# Patient Record
Sex: Female | Born: 1980
Health system: Southern US, Community
[De-identification: ages and names within clinical notes are randomized; demographics above are authoritative.]

## PROBLEM LIST (undated history)

## (undated) DIAGNOSIS — L0390 Cellulitis, unspecified: Secondary | ICD-10-CM

## (undated) DIAGNOSIS — I10 Essential (primary) hypertension: Secondary | ICD-10-CM

## (undated) HISTORY — PX: NO PAST SURGERIES: SHX2092

## (undated) HISTORY — DX: Essential (primary) hypertension: I10

---

## 2002-02-11 ENCOUNTER — Emergency Department (HOSPITAL_COMMUNITY): Admission: EM | Admit: 2002-02-11 | Discharge: 2002-02-11 | Payer: Self-pay | Admitting: *Deleted

## 2002-02-11 ENCOUNTER — Encounter: Payer: Self-pay | Admitting: *Deleted

## 2002-05-12 ENCOUNTER — Emergency Department (HOSPITAL_COMMUNITY): Admission: EM | Admit: 2002-05-12 | Discharge: 2002-05-12 | Payer: Self-pay | Admitting: *Deleted

## 2003-11-04 ENCOUNTER — Emergency Department (HOSPITAL_COMMUNITY): Admission: EM | Admit: 2003-11-04 | Discharge: 2003-11-04 | Payer: Self-pay | Admitting: Emergency Medicine

## 2008-07-24 ENCOUNTER — Emergency Department (HOSPITAL_COMMUNITY): Admission: EM | Admit: 2008-07-24 | Discharge: 2008-07-24 | Payer: Self-pay | Admitting: *Deleted

## 2008-12-19 ENCOUNTER — Emergency Department (HOSPITAL_COMMUNITY): Admission: EM | Admit: 2008-12-19 | Discharge: 2008-12-19 | Payer: Self-pay | Admitting: Emergency Medicine

## 2010-05-22 ENCOUNTER — Emergency Department (HOSPITAL_COMMUNITY): Admission: EM | Admit: 2010-05-22 | Discharge: 2010-05-22 | Payer: Self-pay | Admitting: Emergency Medicine

## 2011-07-14 ENCOUNTER — Encounter: Payer: Self-pay | Admitting: *Deleted

## 2011-07-14 ENCOUNTER — Emergency Department (HOSPITAL_COMMUNITY)
Admission: EM | Admit: 2011-07-14 | Discharge: 2011-07-14 | Disposition: A | Discharge status: Home / Self Care | Payer: Self-pay | Attending: Emergency Medicine | Admitting: Emergency Medicine

## 2011-07-14 DIAGNOSIS — F17200 Nicotine dependence, unspecified, uncomplicated: Secondary | ICD-10-CM | POA: Insufficient documentation

## 2011-07-14 DIAGNOSIS — N949 Unspecified condition associated with female genital organs and menstrual cycle: Secondary | ICD-10-CM | POA: Insufficient documentation

## 2011-07-14 DIAGNOSIS — N938 Other specified abnormal uterine and vaginal bleeding: Secondary | ICD-10-CM | POA: Insufficient documentation

## 2011-07-14 LAB — URINALYSIS, ROUTINE W REFLEX MICROSCOPIC
Bilirubin Urine: NEGATIVE
Glucose, UA: NEGATIVE mg/dL
Hgb urine dipstick: NEGATIVE
Ketones, ur: NEGATIVE mg/dL
Leukocytes, UA: NEGATIVE
Nitrite: NEGATIVE
Protein, ur: NEGATIVE mg/dL
Specific Gravity, Urine: >1.03 — ABNORMAL HIGH (ref 1.005–1.030)
Urobilinogen, UA: 0.2 mg/dL (ref 0.0–1.0)
pH: 5.5 (ref 5.0–8.0)

## 2011-07-14 LAB — WET PREP, GENITAL
Clue Cells Wet Prep HPF POC: NONE SEEN
Trich, Wet Prep: NONE SEEN
Yeast Wet Prep HPF POC: NONE SEEN

## 2011-07-14 LAB — POCT PREGNANCY, URINE: Preg Test, Ur: NEGATIVE

## 2011-07-14 MED ORDER — MEDROXYPROGESTERONE ACETATE 5 MG PO TABS: 10.0000 mg | ORAL_TABLET | Freq: Every day | ORAL | Status: DC

## 2011-07-14 NOTE — ED Notes (Signed)
Patient does not need anything at this time. Patient was informed she could go ahead and get dressed while waiting on UA & pap results

## 2011-07-14 NOTE — ED Notes (Signed)
Vaginal bleeding for 20 days

## 2011-07-23 NOTE — ED Provider Notes (Signed)
History     CSN: [id number] Arrival date & time: 07/14/2011  9:17 PM   First MD Initiated Contact with Patient 07/14/11 2201      Chief Complaint  Patient presents with  . Vaginal Bleeding    (Consider location/radiation/quality/duration/timing/severity/associated sxs/prior treatment) HPI.... vaginal bleeding for 20 days. Several pad changes a day.  No lightheadedness or dizziness.  Is sexually active in a lesbian relationship. Patient states no female sex partners. No vaginal discharge, fever, dysuria, abdominal pain, flank pain.  History reviewed. No pertinent past medical history.  History reviewed. No pertinent past surgical history.  No family history on file.  History  Substance Use Topics  . Smoking status: Current Everyday Smoker -- 0.5 packs/day  . Smokeless tobacco: Not on file  . Alcohol Use: Yes     beer per day    OB History    Grav Para Term Preterm Abortions TAB SAB Ect Mult Living                  Review of Systems  Unable to perform ROS   Allergies  Review of patient's allergies indicates no known allergies.  Home Medications   Current Outpatient Rx  Name Route Sig Dispense Refill  . NAPROXEN SODIUM 220 MG PO TABS Oral Take 440 mg by mouth once as needed. For pain     . MEDROXYPROGESTERONE ACETATE 5 MG PO TABS Oral Take 2 tablets (10 mg total) by mouth daily. 20 tablet 0    BP 124/79  Pulse 86  Temp(Src) 98.7 F (37.1 C) (Oral)  Resp 16  Ht 5\' 6"  (1.676 m)  Wt 171 lb 4.8 oz (77.701 kg)  BMI 27.65 kg/m2  SpO2 99%  LMP 06/24/2011  Physical Exam  Nursing note and vitals reviewed. Constitutional: She is oriented to person, place, and time. She appears well-developed and well-nourished.       Obese  HENT:  Head: Normocephalic and atraumatic.  Eyes: Conjunctivae and EOM are normal. Pupils are equal, round, and reactive to light.  Neck: Normal range of motion. Neck supple.  Cardiovascular: Normal rate and regular rhythm.     Pulmonary/Chest: Effort normal and breath sounds normal.  Abdominal: Soft. Bowel sounds are normal.  Genitourinary:       External genitalia normal.  Slight amount of blood in the posterior vault. No pus or discharge. No adnexal or uterine tenderness  Musculoskeletal: Normal range of motion.  Neurological: She is alert and oriented to person, place, and time.  Skin: Skin is warm and dry.  Psychiatric: She has a normal mood and affect.    ED Course  Procedures (including critical care time)  Labs Reviewed  URINALYSIS, ROUTINE W REFLEX MICROSCOPIC - Abnormal; Notable for the following:    Specific Gravity, Urine >1.030 (*)    All other components within normal limits  WET PREP, GENITAL - Abnormal; Notable for the following:    WBC, Wet Prep HPF POC FEW (*)    All other components within normal limits  GC/CHLAMYDIA PROBE AMP, GENITAL  POCT PREGNANCY, URINE  LAB REPORT - SCANNED   No results found.   1. Dysfunctional uterine bleeding       MDM  Pelvic exam was essentially normal with a small amount of blood. No cervical motion tenderness. Wet prep negative. Will Rx with Megace and get followup with local ob gyn        Donnetta Hutching, MD 07/23/11 [phone]

## 2013-12-21 ENCOUNTER — Emergency Department (HOSPITAL_COMMUNITY)
Admission: EM | Admit: 2013-12-21 | Discharge: 2013-12-21 | Disposition: A | Discharge status: Home / Self Care | Payer: BC Managed Care – PPO | Attending: Emergency Medicine | Admitting: Emergency Medicine

## 2013-12-21 ENCOUNTER — Encounter (HOSPITAL_COMMUNITY): Payer: Self-pay | Admitting: Emergency Medicine

## 2013-12-21 DIAGNOSIS — S51812A Laceration without foreign body of left forearm, initial encounter: Secondary | ICD-10-CM

## 2013-12-21 DIAGNOSIS — W268XXA Contact with other sharp object(s), not elsewhere classified, initial encounter: Secondary | ICD-10-CM | POA: Insufficient documentation

## 2013-12-21 DIAGNOSIS — W01119A Fall on same level from slipping, tripping and stumbling with subsequent striking against unspecified sharp object, initial encounter: Secondary | ICD-10-CM | POA: Insufficient documentation

## 2013-12-21 DIAGNOSIS — Y9389 Activity, other specified: Secondary | ICD-10-CM | POA: Insufficient documentation

## 2013-12-21 DIAGNOSIS — S51809A Unspecified open wound of unspecified forearm, initial encounter: Secondary | ICD-10-CM | POA: Insufficient documentation

## 2013-12-21 DIAGNOSIS — Y92009 Unspecified place in unspecified non-institutional (private) residence as the place of occurrence of the external cause: Secondary | ICD-10-CM | POA: Insufficient documentation

## 2013-12-21 DIAGNOSIS — Z23 Encounter for immunization: Secondary | ICD-10-CM | POA: Insufficient documentation

## 2013-12-21 DIAGNOSIS — Z79899 Other long term (current) drug therapy: Secondary | ICD-10-CM | POA: Insufficient documentation

## 2013-12-21 DIAGNOSIS — F17200 Nicotine dependence, unspecified, uncomplicated: Secondary | ICD-10-CM | POA: Insufficient documentation

## 2013-12-21 MED ORDER — TETANUS-DIPHTH-ACELL PERTUSSIS 5-2.5-18.5 LF-MCG/0.5 IM SUSP
0.5000 mL | Freq: Once | INTRAMUSCULAR | Status: AC
  Administered 2013-12-21: 0.5 mL via INTRAMUSCULAR
  Filled 2013-12-21: qty 0.5

## 2013-12-21 NOTE — ED Provider Notes (Signed)
CSN: [id number]     Arrival date & time 12/21/13  1655 History   First MD Initiated Contact with Patient 12/21/13 1729     Chief Complaint  Patient presents with  . Extremity Laceration     (Consider location/radiation/quality/duration/timing/severity/associated sxs/prior Treatment) Patient is a 33 y.o. female presenting with skin laceration. The history is provided by the patient.  Laceration Location:  Shoulder/arm Shoulder/arm laceration location:  L forearm Length (cm):  1 Depth:  Cutaneous Quality: straight   Bleeding: controlled   Time since incident:  1 hour Laceration mechanism:  Broken glass Foreign body present:  No foreign bodies Ineffective treatments:  None tried Tetanus status:  Out of date  Caroline Barnes is a 33 y.o. female who presents to the ED with a laceration to the left forearm. She states that she tripped and cut her arm on a broken mirror. She denies any other injuries.   History reviewed. No pertinent past medical history. History reviewed. No pertinent past surgical history. No family history on file. History  Substance Use Topics  . Smoking status: Current Every Day Smoker -- 0.50 packs/day    Types: Cigarettes  . Smokeless tobacco: Not on file  . Alcohol Use: Yes     Comment: beer per day   OB History   Grav Para Term Preterm Abortions TAB SAB Ect Mult Living                 Review of Systems Negative except as stated in HPI   Allergies  Review of patient's allergies indicates no known allergies.  Home Medications   Current Outpatient Rx  Name  Route  Sig  Dispense  Refill  . EXPIRED: medroxyPROGESTERone (PROVERA) 5 MG tablet   Oral   Take 2 tablets (10 mg total) by mouth daily.   20 tablet   0   . naproxen sodium (ANAPROX) 220 MG tablet   Oral   Take 440 mg by mouth once as needed. For pain           BP 133/76  Pulse 81  Temp(Src) 98.4 F (36.9 C) (Oral)  Resp 18  Ht 5\' 6"  (1.676 m)  Wt 150 lb (68.04 kg)  BMI  24.22 kg/m2  SpO2 99%  LMP 12/14/2013 Physical Exam  Nursing note and vitals reviewed. Constitutional: She is oriented to person, place, and time. She appears well-developed and well-nourished. No distress.  HENT:  Head: Normocephalic.  Eyes: EOM are normal.  Neck: Neck supple.  Cardiovascular: Normal rate.   Pulmonary/Chest: Effort normal.  Abdominal: Soft.  Musculoskeletal: Normal range of motion.       Left forearm: She exhibits no tenderness, no swelling and no deformity.       Arms: 1 cm superficial laceration. Good strength, adequate circulation, good touch sensation.   Neurological: She is alert and oriented to person, place, and time. No cranial nerve deficit.  Skin: Skin is warm and dry.  Psychiatric: She has a normal mood and affect. Her behavior is normal.    ED Course  Procedures Wound cleaned with betadine and skin cleanser. Closed with Dermabond without difficulty.  Tdap given MDM  33 y.o. female with 1 cm laceration to the left forearm closed with Dermabond. She will return for any problems. Discussed with the patient and all questioned fully answered.    [facility] Orlene OchM Neese, [place]NP 12/21/13 [phone]

## 2013-12-21 NOTE — ED Notes (Signed)
Pt states she fell on a mirror at home 1 hour PTA. Small Lac to left forearm. Bleeding controlled.

## 2013-12-23 NOTE — ED Provider Notes (Signed)
Medical screening examination/treatment/procedure(s) were performed by non-physician practitioner and as supervising physician I was immediately available for consultation/collaboration.   Hurman HornJohn M Kuron Docken, MD 12/23/13 [phone]

## 2015-07-24 ENCOUNTER — Encounter (HOSPITAL_COMMUNITY): Payer: Self-pay | Admitting: Emergency Medicine

## 2015-07-24 ENCOUNTER — Emergency Department (HOSPITAL_COMMUNITY)
Admission: EM | Admit: 2015-07-24 | Discharge: 2015-07-24 | Disposition: A | Discharge status: Home / Self Care | Payer: BLUE CROSS/BLUE SHIELD | Attending: Emergency Medicine | Admitting: Emergency Medicine

## 2015-07-24 DIAGNOSIS — Z79899 Other long term (current) drug therapy: Secondary | ICD-10-CM | POA: Diagnosis not present

## 2015-07-24 DIAGNOSIS — R21 Rash and other nonspecific skin eruption: Secondary | ICD-10-CM | POA: Diagnosis present

## 2015-07-24 DIAGNOSIS — L0100 Impetigo, unspecified: Secondary | ICD-10-CM

## 2015-07-24 DIAGNOSIS — Z720 Tobacco use: Secondary | ICD-10-CM | POA: Insufficient documentation

## 2015-07-24 MED ORDER — CLINDAMYCIN HCL 300 MG PO CAPS: 300.0000 mg | ORAL_CAPSULE | Freq: Four times a day (QID) | ORAL | Status: DC

## 2015-07-24 MED ORDER — TRIAMCINOLONE ACETONIDE 0.1 % EX CREA: 1.0000 "application " | TOPICAL_CREAM | Freq: Three times a day (TID) | CUTANEOUS | Status: DC

## 2015-07-24 MED ORDER — DIPHENHYDRAMINE HCL 25 MG PO CAPS
25.0000 mg | ORAL_CAPSULE | Freq: Once | ORAL | Status: AC
  Administered 2015-07-24: 25 mg via ORAL
  Filled 2015-07-24: qty 1

## 2015-07-24 MED ORDER — CLINDAMYCIN HCL 150 MG PO CAPS
300.0000 mg | ORAL_CAPSULE | Freq: Once | ORAL | Status: AC
  Administered 2015-07-24: 300 mg via ORAL
  Filled 2015-07-24: qty 2

## 2015-07-24 NOTE — Discharge Instructions (Signed)
Impetigo, Adult  Impetigo is an infection of the skin. It commonly occurs in young children, but it can also occur in adults. The infection causes itchy blisters and sores that produce brownish-yellow fluid. As the fluid dries, it forms a thick, honey-colored crust. These skin changes usually occur on the face but can also affect other areas of the body. Impetigo usually goes away in 7-10 days with treatment.  CAUSES  Impetigo is caused by two types of bacteria. It may be caused by staphylococci or streptococci bacteria. These bacteria cause impetigo when they get under the surface of the skin. This often happens after some damage to the skin, such as damage from:  · Cuts, scrapes, or scratches.  · Insect bites, especially when you scratch the area of a bite.  · Chickenpox or other illnesses that cause open skin sores.  · Nail biting or chewing.  Impetigo is contagious and can spread easily from one person to another. This may occur through close skin contact or by sharing towels, clothing, or other items with a person who has the infection.  RISK FACTORS  Some things that can increase the risk of getting this infection include:  · Playing sports that include skin-to-skin contact with others.  · Having a skin condition with open sores.  · Having many skin cuts or scrapes.  · Living in an area that has high humidity levels.  · Having poor hygiene.  · Having high levels of staphylococci in your nose.  SIGNS AND SYMPTOMS  Impetigo usually starts out as small blisters, often on the face. The blisters then break open and turn into tiny sores (lesions) with a yellow crust. In some cases, the blisters cause itching or burning. With scratching, irritation, or lack of treatment, these small lesions may get larger. Scratching can also cause impetigo to spread to other parts of the body. The bacteria can get under the fingernails and spread when you touch another area of your skin.  Other possible symptoms include:  · Larger  blisters.  · Pus.  · Swollen lymph glands.  DIAGNOSIS  This condition is usually diagnosed during a physical exam. A skin sample or sample of fluid from a blister may be taken for lab tests that involve growing bacteria (culture test). This can help confirm the diagnosis or help determine the best treatment.  TREATMENT  Mild impetigo can be treated with prescription antibiotic cream. Oral antibiotic medicine may be used in more severe cases. Medicines for itching may also be used.  HOME CARE INSTRUCTIONS  · Take medicines only as directed by your health care provider.  · To help prevent impetigo from spreading to other body areas:    Keep your fingernails short and clean.    Do not scratch the blisters or sores.    Cover infected areas, if necessary, to keep from scratching.  · Gently wash the infected areas with antibiotic soap and water.  · Soak crusted areas in warm, soapy water using antibiotic soap.    Gently rub the areas to remove crusts. Do not scrub.  · Wash your hands often to avoid spreading this infection.  · Stay home until you have used an antibiotic cream for 48 hours (2 days) or an oral antibiotic medicine for 24 hours (1 day). You should only return to work and activities with other people if your skin shows significant improvement.  PREVENTION   To keep the infection from spreading:  · Stay home until you have used   an antibiotic cream for 48 hours or an oral antibiotic for 24 hours.  · Wash your hands often.  · Do not engage in skin-to-skin contact with other people while you have still have blisters.  · Do not share towels, washcloths, or bedding with others while you have the infection.  SEEK MEDICAL CARE IF:  · You develop more blisters or sores despite treatment.  · Other family members get sores.  · Your skin sores are not improving after 48 hours of treatment.  · You have a fever.  SEEK IMMEDIATE MEDICAL CARE IF:  · You see spreading redness or swelling of the skin around your sores.  · You  see red streaks coming from your sores.  · You develop a sore throat.     This information is not intended to replace advice given to you by your health care provider. Make sure you discuss any questions you have with your health care provider.     Document Released: 09/29/2014 Document Reviewed: 09/29/2014  Elsevier Interactive Patient Education ©2016 [employer]

## 2015-07-24 NOTE — ED Notes (Signed)
Pt reports recurrent "boils" . Pt reports sites "pop" and reprots began to itch. Pt reports scratching and intense itching persists. Pt denies any fever,n/v.

## 2015-07-24 NOTE — ED Notes (Signed)
Patient given discharge instruction, verbalized understand. Patient ambulatory out of the department.  

## 2015-07-24 NOTE — ED Notes (Signed)
Multiple abrasion to lower left leg

## 2015-07-25 NOTE — ED Provider Notes (Signed)
CSN: [id number]     Arrival date & time 07/24/15  1708 History   First MD Initiated Contact with Patient 07/24/15 1734     Chief Complaint  Patient presents with  . Recurrent Skin Infections     (Consider location/radiation/quality/duration/timing/severity/associated sxs/prior Treatment) HPI   Caroline Barnes is a 34 y.o. female who presents to the Emergency Department complaining of itching and open "sores" to her left lower leg.  Symptoms present for several days.  She states the lesions appear to be "blisters or boils" and she scratches them and they leave an open area and itch.  She denies swelling, fever, chills.  She has not used any medications, denies new soap, detergents or chemical exposures.  History reviewed. No pertinent past medical history. History reviewed. No pertinent past surgical history. History reviewed. No pertinent family history. Social History  Substance Use Topics  . Smoking status: Current Every Day Smoker -- 0.50 packs/day    Types: Cigarettes  . Smokeless tobacco: None  . Alcohol Use: Yes     Comment: beer per day   OB History    No data available     Review of Systems  Constitutional: Negative for fever, chills, activity change and appetite change.  HENT: Negative for facial swelling, sore throat and trouble swallowing.   Respiratory: Negative for chest tightness, shortness of breath and wheezing.   Musculoskeletal: Negative for neck pain and neck stiffness.  Skin: Positive for rash. Negative for wound.  Neurological: Negative for dizziness, weakness, numbness and headaches.  All other systems reviewed and are negative.     Allergies  Review of patient's allergies indicates no known allergies.  Home Medications   Prior to Admission medications   Medication Sig Start Date End Date Taking? Authorizing Provider  clindamycin (CLEOCIN) 300 MG capsule Take 1 capsule (300 mg total) by mouth 4 (four) times daily. For 7 days 07/24/15   Shaylie Eklund, PA-C  medroxyPROGESTERone (PROVERA) 5 MG tablet Take 2 tablets (10 mg total) by mouth daily. 07/14/11 07/13/12  Donnetta Hutching, MD  naproxen sodium (ANAPROX) 220 MG tablet Take 440 mg by mouth once as needed. For pain     Historical Provider, MD  triamcinolone cream (KENALOG) 0.1 % Apply 1 application topically 3 (three) times daily. 07/24/15   Tricia Oaxaca, PA-C   BP 148/98 mmHg  Pulse 81  Temp(Src) 98.5 F (36.9 C) (Oral)  Resp 18  Ht  (1.676 m)  Wt 150 lb (68.04 kg)  BMI 24.22 kg/m2  SpO2 100%  LMP 06/09/2015 Physical Exam  Constitutional: She is oriented to person, place, and time. She appears well-developed and well-nourished. No distress.  HENT:  Head: Normocephalic and atraumatic.  Mouth/Throat: Oropharynx is clear and moist.  Neck: Normal range of motion. Neck supple.  Cardiovascular: Normal rate, regular rhythm, normal heart sounds and intact distal pulses.   No murmur heard. Pulmonary/Chest: Effort normal and breath sounds normal. No respiratory distress.  Musculoskeletal: She exhibits no edema or tenderness.  Lymphadenopathy:    She has no cervical adenopathy.  Neurological: She is alert and oriented to person, place, and time. She exhibits normal muscle tone. Coordination normal.  Skin: Skin is warm. Rash noted. There is erythema.  Open macular lesions to the left lower leg, mild crusting.  Few excoriations.  No edema or drainage  Nursing note and vitals reviewed.   ED Course  Procedures (including critical care time)   MDM   Final diagnoses:  Impetigo  Pt well appearing, vitals stable.  Rash to lower left leg appears c/w impetigo.  No abscess.  Ambulatory, no abscess.  Rx for steroid cream and clindamycin    Pauline Ausammy Jesly Hartmann, PA-C 07/25/15 1203  Bethann BerkshireJoseph Zammit, MD 07/27/15 [phone]

## 2015-07-27 ENCOUNTER — Encounter (HOSPITAL_COMMUNITY): Payer: Self-pay | Admitting: *Deleted

## 2015-07-27 ENCOUNTER — Emergency Department (HOSPITAL_COMMUNITY)
Admission: EM | Admit: 2015-07-27 | Discharge: 2015-07-27 | Disposition: A | Discharge status: Home / Self Care | Payer: BLUE CROSS/BLUE SHIELD | Source: Home / Self Care | Attending: Emergency Medicine | Admitting: Emergency Medicine

## 2015-07-27 DIAGNOSIS — L089 Local infection of the skin and subcutaneous tissue, unspecified: Secondary | ICD-10-CM

## 2015-07-27 DIAGNOSIS — T148XXA Other injury of unspecified body region, initial encounter: Principal | ICD-10-CM

## 2015-07-27 DIAGNOSIS — L03115 Cellulitis of right lower limb: Secondary | ICD-10-CM | POA: Diagnosis not present

## 2015-07-27 LAB — CBC WITH DIFFERENTIAL/PLATELET
BASOS ABS: 0 10*3/uL (ref 0.0–0.1)
Basophils Relative: 1 %
EOS PCT: 6 %
Eosinophils Absolute: 0.4 10*3/uL (ref 0.0–0.7)
HCT: 37.5 % (ref 36.0–46.0)
Hemoglobin: 12.5 g/dL (ref 12.0–15.0)
LYMPHS ABS: 1.8 10*3/uL (ref 0.7–4.0)
LYMPHS PCT: 29 %
MCH: 28.7 pg (ref 26.0–34.0)
MCHC: 33.3 g/dL (ref 30.0–36.0)
MCV: 86.2 fL (ref 78.0–100.0)
MONO ABS: 0.4 10*3/uL (ref 0.1–1.0)
MONOS PCT: 6 %
Neutro Abs: 3.6 10*3/uL (ref 1.7–7.7)
Neutrophils Relative %: 58 %
Platelets: 314 10*3/uL (ref 150–400)
RBC: 4.35 MIL/uL (ref 3.87–5.11)
RDW: 13.2 % (ref 11.5–15.5)
WBC: 6.2 10*3/uL (ref 4.0–10.5)

## 2015-07-27 LAB — COMPREHENSIVE METABOLIC PANEL
ALBUMIN: 4.2 g/dL (ref 3.5–5.0)
ALK PHOS: 42 U/L (ref 38–126)
ALT: 8 U/L — ABNORMAL LOW (ref 14–54)
ANION GAP: 6 (ref 5–15)
AST: 18 U/L (ref 15–41)
BILIRUBIN TOTAL: 0.4 mg/dL (ref 0.3–1.2)
BUN: 13 mg/dL (ref 6–20)
CALCIUM: 8.7 mg/dL — AB (ref 8.9–10.3)
CO2: 23 mmol/L (ref 22–32)
Chloride: 106 mmol/L (ref 101–111)
Creatinine, Ser: 0.78 mg/dL (ref 0.44–1.00)
GFR calc non Af Amer: >60 mL/min (ref >60)
GLUCOSE: 81 mg/dL (ref 65–99)
POTASSIUM: 3.5 mmol/L (ref 3.5–5.1)
Sodium: 135 mmol/L (ref 135–145)
TOTAL PROTEIN: 8 g/dL (ref 6.5–8.1)

## 2015-07-27 MED ORDER — DEXTROSE 5 % IV SOLN
1.0000 g | Freq: Once | INTRAVENOUS | Status: AC
  Administered 2015-07-27: 1 g via INTRAVENOUS
  Filled 2015-07-27: qty 10

## 2015-07-27 MED ORDER — SULFAMETHOXAZOLE-TRIMETHOPRIM 800-160 MG PO TABS: 1.0000 | ORAL_TABLET | Freq: Two times a day (BID) | ORAL | Status: DC

## 2015-07-27 MED ORDER — IBUPROFEN 800 MG PO TABS
800.0000 mg | ORAL_TABLET | Freq: Once | ORAL | Status: AC
  Administered 2015-07-27: 800 mg via ORAL
  Filled 2015-07-27: qty 1

## 2015-07-27 NOTE — ED Provider Notes (Signed)
CSN: [id number]     Arrival date & time 07/27/15  1933 History   First MD Initiated Contact with Patient 07/27/15 2002     Chief Complaint  Patient presents with  . Wound Infection     (Consider location/radiation/quality/duration/timing/severity/associated sxs/prior Treatment) Patient is a 34 y.o. female presenting with rash. The history is provided by the patient. No language interpreter was used.  Rash Location:  Leg Leg rash location:  R upper leg Quality: blistering, redness and swelling   Severity:  Moderate Onset quality:  Gradual Duration:  2 days Timing:  Constant Progression:  Worsening Chronicity:  New Context: not animal contact, not hot tub use and not insect bite/sting   Relieved by:  Nothing Ineffective treatments:  Topical steroids Associated symptoms: no sore throat   Pt is on clindamycin and topical steroids from ED visit on 11/1.  Pt reports now increased redness and swelling to right leg.  Pt has an area with a blister in the center  History reviewed. No pertinent past medical history. History reviewed. No pertinent past surgical history. No family history on file. Social History  Substance Use Topics  . Smoking status: Current Every Day Smoker -- 0.50 packs/day    Types: Cigarettes  . Smokeless tobacco: None  . Alcohol Use: Yes     Comment: beer per day   OB History    No data available     Review of Systems  HENT: Negative for sore throat.   Skin: Positive for rash.  All other systems reviewed and are negative.     Allergies  Review of patient's allergies indicates no known allergies.  Home Medications   Prior to Admission medications   Medication Sig Start Date End Date Taking? Authorizing Provider  clindamycin (CLEOCIN) 300 MG capsule Take 1 capsule (300 mg total) by mouth 4 (four) times daily. For 7 days 07/24/15  Yes Tammy Triplett, PA-C  naproxen sodium (ANAPROX) 220 MG tablet Take 440 mg by mouth once as needed. For pain    Yes  Historical Provider, MD  triamcinolone cream (KENALOG) 0.1 % Apply 1 application topically 3 (three) times daily. 07/24/15  Yes Tammy Triplett, PA-C   BP 143/100 mmHg  Pulse 79  Temp(Src) 98.3 F (36.8 C) (Oral)  Resp 20  Ht 5\' 6"  (1.676 m)  Wt 150 lb (68.04 kg)  BMI 24.22 kg/m2  SpO2 100%  LMP 07/24/2015 Physical Exam  Constitutional: She is oriented to person, place, and time. She appears well-developed and well-nourished.  HENT:  Head: Normocephalic.  Eyes: EOM are normal.  Neck: Normal range of motion.  Pulmonary/Chest: Effort normal.  Abdominal: She exhibits no distension.  Musculoskeletal: Normal range of motion.  Neurological: She is alert and oriented to person, place, and time.  Skin: Skin is warm. There is erythema.  20 cm red area right upper outer thigh,  Blister in center.   Psychiatric: She has a normal mood and affect.  Nursing note and vitals reviewed.   ED Course  Procedures (including critical care time) Labs Review Labs Reviewed  CBC WITH DIFFERENTIAL/PLATELET  COMPREHENSIVE METABOLIC PANEL  RPR  HIV ANTIBODY (ROUTINE TESTING)    Imaging Review No results found. I have personally reviewed and evaluated these images and lab results as part of my medical decision-making.   EKG Interpretation None      MDM  Cbc and cmet unremarkable.   Pt given Iv rocephin.  Bandage to area.  RPR and hiv pending.   Pt advised  to recheck here tomorrow.    Final diagnoses:  Wound infection [facility])    Bactrim     Elson Areas, PA-C 07/27/15 2218  Donnetta Hutching, MD 07/28/15 [phone]

## 2015-07-27 NOTE — Discharge Instructions (Signed)

## 2015-07-27 NOTE — ED Notes (Signed)
Seen this week for infected legs, states the areas are worse

## 2015-07-28 ENCOUNTER — Inpatient Hospital Stay (HOSPITAL_COMMUNITY)
Admission: EM | Admit: 2015-07-28 | Discharge: 2015-07-30 | DRG: 603 | Disposition: A | Discharge status: Home / Self Care | Payer: BLUE CROSS/BLUE SHIELD | Attending: Internal Medicine | Admitting: Internal Medicine

## 2015-07-28 ENCOUNTER — Encounter (HOSPITAL_COMMUNITY): Payer: Self-pay | Admitting: *Deleted

## 2015-07-28 DIAGNOSIS — Z833 Family history of diabetes mellitus: Secondary | ICD-10-CM

## 2015-07-28 DIAGNOSIS — Z808 Family history of malignant neoplasm of other organs or systems: Secondary | ICD-10-CM | POA: Diagnosis not present

## 2015-07-28 DIAGNOSIS — Z720 Tobacco use: Secondary | ICD-10-CM | POA: Diagnosis not present

## 2015-07-28 DIAGNOSIS — L03116 Cellulitis of left lower limb: Secondary | ICD-10-CM | POA: Diagnosis present

## 2015-07-28 DIAGNOSIS — L03115 Cellulitis of right lower limb: Secondary | ICD-10-CM | POA: Diagnosis present

## 2015-07-28 DIAGNOSIS — L03317 Cellulitis of buttock: Secondary | ICD-10-CM | POA: Diagnosis not present

## 2015-07-28 DIAGNOSIS — F17200 Nicotine dependence, unspecified, uncomplicated: Secondary | ICD-10-CM | POA: Diagnosis present

## 2015-07-28 LAB — CBC WITH DIFFERENTIAL/PLATELET
BASOS ABS: 0 10*3/uL (ref 0.0–0.1)
BASOS PCT: 0 %
Eosinophils Absolute: 0.7 10*3/uL (ref 0.0–0.7)
Eosinophils Relative: 9 %
HCT: 36.7 % (ref 36.0–46.0)
Hemoglobin: 12.3 g/dL (ref 12.0–15.0)
Lymphocytes Relative: 20 %
Lymphs Abs: 1.5 10*3/uL (ref 0.7–4.0)
MCH: 28.6 pg (ref 26.0–34.0)
MCHC: 33.5 g/dL (ref 30.0–36.0)
MCV: 85.3 fL (ref 78.0–100.0)
MONO ABS: 0.6 10*3/uL (ref 0.1–1.0)
Monocytes Relative: 9 %
NEUTROS ABS: 4.4 10*3/uL (ref 1.7–7.7)
NEUTROS PCT: 62 %
PLATELETS: 341 10*3/uL (ref 150–400)
RBC: 4.3 MIL/uL (ref 3.87–5.11)
RDW: 12.7 % (ref 11.5–15.5)
WBC: 7.2 10*3/uL (ref 4.0–10.5)

## 2015-07-28 LAB — COMPREHENSIVE METABOLIC PANEL
ALBUMIN: 4.1 g/dL (ref 3.5–5.0)
ALT: 15 U/L (ref 14–54)
AST: 17 U/L (ref 15–41)
Alkaline Phosphatase: 50 U/L (ref 38–126)
Anion gap: 6 (ref 5–15)
BUN: 15 mg/dL (ref 6–20)
CHLORIDE: 107 mmol/L (ref 101–111)
CO2: 25 mmol/L (ref 22–32)
Calcium: 9 mg/dL (ref 8.9–10.3)
Creatinine, Ser: 0.76 mg/dL (ref 0.44–1.00)
GFR calc Af Amer: >60 mL/min (ref >60)
GLUCOSE: 85 mg/dL (ref 65–99)
POTASSIUM: 3.8 mmol/L (ref 3.5–5.1)
SODIUM: 138 mmol/L (ref 135–145)
Total Bilirubin: 0.5 mg/dL (ref 0.3–1.2)
Total Protein: 7.7 g/dL (ref 6.5–8.1)

## 2015-07-28 LAB — MRSA PCR SCREENING: MRSA by PCR: NEGATIVE

## 2015-07-28 MED ORDER — ONDANSETRON HCL 4 MG/2ML IJ SOLN
4.0000 mg | Freq: Three times a day (TID) | INTRAMUSCULAR | Status: AC | PRN
Start: 2015-07-28 — End: 2015-07-29

## 2015-07-28 MED ORDER — VANCOMYCIN HCL IN DEXTROSE 1-5 GM/200ML-% IV SOLN: 1000.0000 mg | Freq: Once | INTRAVENOUS | Status: DC

## 2015-07-28 MED ORDER — VANCOMYCIN HCL IN DEXTROSE 1-5 GM/200ML-% IV SOLN
INTRAVENOUS | Status: AC
  Filled 2015-07-28: qty 200

## 2015-07-28 MED ORDER — NICOTINE 14 MG/24HR TD PT24
14.0000 mg | MEDICATED_PATCH | Freq: Every day | TRANSDERMAL | Status: DC
  Administered 2015-07-28 – 2015-07-30 (×3): 14 mg via TRANSDERMAL
  Filled 2015-07-28 (×3): qty 1

## 2015-07-28 MED ORDER — ONDANSETRON HCL 4 MG/2ML IJ SOLN
4.0000 mg | Freq: Once | INTRAMUSCULAR | Status: AC
  Administered 2015-07-28: 4 mg via INTRAVENOUS
  Filled 2015-07-28: qty 2

## 2015-07-28 MED ORDER — VANCOMYCIN HCL IN DEXTROSE 1-5 GM/200ML-% IV SOLN
1000.0000 mg | Freq: Two times a day (BID) | INTRAVENOUS | Status: DC
  Administered 2015-07-29 – 2015-07-30 (×3): 1000 mg via INTRAVENOUS
  Filled 2015-07-28 (×5): qty 200

## 2015-07-28 MED ORDER — ACETAMINOPHEN 650 MG RE SUPP: 650.0000 mg | Freq: Four times a day (QID) | RECTAL | Status: DC | PRN

## 2015-07-28 MED ORDER — SODIUM CHLORIDE 0.9 % IV SOLN
Freq: Once | INTRAVENOUS | Status: AC
Start: 2015-07-28 — End: 2015-07-28
  Administered 2015-07-28: 18:00:00 via INTRAVENOUS

## 2015-07-28 MED ORDER — ACETAMINOPHEN 325 MG PO TABS: 650.0000 mg | ORAL_TABLET | Freq: Four times a day (QID) | ORAL | Status: DC | PRN

## 2015-07-28 MED ORDER — IBUPROFEN 400 MG PO TABS
400.0000 mg | ORAL_TABLET | Freq: Four times a day (QID) | ORAL | Status: DC | PRN
  Administered 2015-07-28 – 2015-07-30 (×3): 400 mg via ORAL
  Filled 2015-07-28 (×3): qty 1

## 2015-07-28 MED ORDER — SODIUM CHLORIDE 0.9 % IV SOLN
1500.0000 mg | Freq: Once | INTRAVENOUS | Status: AC
Start: 2015-07-28 — End: 2015-07-29
  Administered 2015-07-28: 1500 mg via INTRAVENOUS
  Filled 2015-07-28: qty 1500

## 2015-07-28 MED ORDER — VANCOMYCIN HCL IN DEXTROSE 1-5 GM/200ML-% IV SOLN
1000.0000 mg | Freq: Once | INTRAVENOUS | Status: DC
  Filled 2015-07-28: qty 200

## 2015-07-28 MED ORDER — HYDROMORPHONE HCL 1 MG/ML IJ SOLN
1.0000 mg | Freq: Once | INTRAMUSCULAR | Status: AC
  Administered 2015-07-28: 1 mg via INTRAVENOUS
  Filled 2015-07-28: qty 1

## 2015-07-28 NOTE — H&P (Signed)
History and Physical  Patient Name: Caroline Barnes     DDU:[id number]    DOB: [birth year]    DOA: 07/28/2015 Referring physician: Campbell Lerner, PA-C PCP: No PCP Per Patient      Chief Complaint: Rash  HPI: Caroline Barnes is a 34 y.o. female with no significant past medical history who presents with rash having failed outpatient treatment.  The patient first developed a few "sore spots" on her left ankle about a week ago. These were itchy and developed into tense blisters that ruptured with clear fluid and resulted in small ulcers. She was seen for these 4 days ago and started on oral clindamycin for presumed impetigo superinfection.  The ulcers on the left lower leg improved but 2 days ago the patient developed new "sore spots" on her right outer thigh and buttock that again developed into confluent tense fluid-filled bullae. The patient was seen in the ER yesterday given a dose of ceftriaxone and had her clindamycin switched to Bactrim.  Today, the patient left work because her sores were worse. She had stopped the clindamycin but not yet started Bactrim. She was afebrile and not tachycardic and had no leukocytosis. There was a large area of redness and swelling and induration in the right buttock around the ruptured bullae that had spread in the interim to the inner thigh.   At no point was there fever, chills, joint aches, mouth sores, myalgias, abdominal cramps, rash on sun exposed areas. She has a history of similar bullae in the armpits that didn't result in any problems.  No known similarly affected contacts in her household. No new exposures at work.   Review of Systems:  Pt complains of  rash, bullae, swelling, insect bites?. Pt denies any fever, chills, joint aches, mouth sores, myalgias, abdominal cramps, rash on sun exposed areas.  All other systems negative except as just noted or noted in the history of present illness.   Allergies: No Known Allergies  Home  medications: None    Past medical history: None  Past surgical history: Never  Family history: Mother with diabetes and thyroid cancer. Father with alcohol abuse. Cousin with rash "contracted from football team" not sure if it was MRSA. No family history of lupus, psoriasis, or other known skin conditions or autoimmune diseases.   Social History: Patient lives with her girlfriend and 2 children. She is an active smoker. She drinks between a half pint and a pint of spirits per day. Cage questionnaire negative, denies withdrawal symptoms ever. 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        Physical Exam: BP 130/73 mmHg  Pulse 80  Temp(Src) 98.4 F (36.9 C) (Oral)  Resp 20  SpO2 100%  LMP 07/24/2015 General appearance: Well-developed, adult female, alert and in no acute distress.   Eyes: Anicteric, conjunctiva pink, lids and lashes normal.     ENT: No nasal deformity, discharge, or epistaxis.  Edentulous.  Lips normal.  OP moist without lesions or ulcers.   Skin: Warm and dry.  No jaundice.  There are innumerable hyperpigmented macules on the arms and legs from old "insect bites". On the left ankle there are 2 well healing 2 cm diameter flat ulcers without discharge or surrounding erythema or swelling. On the right outer buttock there is a 3 x 4 cm ruptured bulla surrounded by dusky, swelling and induration and pain. This extends into the right groin.  Cardiac: RRR, nl S1-S2, no murmurs appreciated.  Capillary refill is brisk. No LE edema.  Radial pulses 2+ and symmetric. Respiratory: Normal respiratory rate and rhythm.  CTAB without rales or wheezes. Abdomen: Abdomen soft without rigidity.  No TTP. No ascites, distension.   MSK: No deformities or effusions. Neuro: Sensorium intact and responding to questions, attention normal.  Speech is fluent.  Moves all extremities equally and with normal coordination.    Psych: Behavior appropriate.  Affect normal.  No  evidence of aural or visual hallucinations or delusions.       Labs on Admission:  The metabolic panel shows normal sodium, potassium, serum creatinine. The AST and ALT are normal. The total bilirubin is normal. The albumin is normal.  The complete blood count shows no leukocytosis, anemia, or thrombocytopenia.     Assessment/Plan 1. Rash, cellulitis:  This is new.  The patient has bullae that appear now superinfected and have failed clindamycin and ceftriaxone (the patient tells me she never got to try Bactrim).  The most likely cause to me is a bullous reaction to an arthropod bite now with superinfection and cellulitis.  I would estimate that she is at somewhat elevated risk for CA MRSA, given the numerous folliculitis scars on her extremities.  Other possible diagnostic considerations are PCT (alcohol history, however no further distribution), bullous pemphigoid (the patient has a picture on her phone where the current thigh bulla is clearly surrounded by a large urticarial plaque). -Vancomycin IV  -Observe      DVT PPx: Low risk Diet: Regular Code Status: Full Family Communication: Fiancee at bedside  Medical decision making: What exists of the patient's previous chart was reviewed in depth and the case was discussed with Campbell LernerSophia Leslie, PA-C. Patient seen 8:05 PM on 07/28/2015.  Disposition Plan:  Admit for IV vancomycin.  If improved, consider discharge on Bactrim in 1-2 days with dermatology follow up.     Alberteen SamChristopher P Deniah Saia [facility] Pager [phone]

## 2015-07-28 NOTE — Progress Notes (Signed)
ANTIBIOTIC CONSULT NOTE - INITIAL  Pharmacy Consult for Vancomycin Indication: cellulitis  No Known Allergies  Patient Measurements: Height: 5\' 6"  (167.6 cm) Weight: 155 lb 12.8 oz (70.67 kg) IBW/kg (Calculated) : 59.3 Adjusted Body Weight:   Vital Signs: Temp: 98.2 F (36.8 C) (11/05 2100) Temp Source: Oral (11/05 2100) BP: 153/87 mmHg (11/05 2100) Pulse Rate: 61 (11/05 2100) Intake/Output from previous day:   Intake/Output from this shift: Total I/O In: 240 [P.O.:240] Out: -   Labs:  Recent Labs  07/27/15 2037 07/28/15 1800  WBC 6.2 7.2  HGB 12.5 12.3  PLT 314 341  CREATININE 0.78 0.76   Estimated Creatinine Clearance: 95.4 mL/min (by C-G formula based on Cr of 0.76). No results for input(s): VANCOTROUGH, VANCOPEAK, VANCORANDOM, GENTTROUGH, GENTPEAK, GENTRANDOM, TOBRATROUGH, TOBRAPEAK, TOBRARND, AMIKACINPEAK, AMIKACINTROU, AMIKACIN in the last 72 hours.   Microbiology: No results found for this or any previous visit (from the past 720 hour(s)).  Medical History: History reviewed. No pertinent past medical history.  Medications:  Scheduled:  . vancomycin  1,500 mg Intravenous Once  . [START ON 07/29/2015] vancomycin  1,000 mg Intravenous Q12H   Assessment:  Rash, cellulitis. The patient has bullae that appear now superinfected and have failed clindamycin and ceftriaxone (the patient never got to try Bactrim). The most likely cause is a bullous reaction to an arthropod bite now with superinfection and cellulitis.Elevated risk for MRSA, given the numerous folliculitis scars on her extremities  Goal of therapy: Vancomycin trough level 10-15 mcg/ml  Plan:  Vancomycin 1500 mg loading dose, then Vancomycin 1 GM IV every 12 hours Vancomycin trough at steady state Monitor renal function Labs per protocol  Raquel JamesPittman, Shanel Prazak Bennett 07/28/2015,9:47 PM

## 2015-07-28 NOTE — ED Notes (Signed)
Pt was here yesterday for abscess to right upper leg. States that the abscess began draining today while at work. Redness has extended significatly outside of marked line.

## 2015-07-28 NOTE — ED Provider Notes (Signed)
CSN: [id number]     Arrival date & time 07/28/15  1726 History   First MD Initiated Contact with Patient 07/28/15 1734     Chief Complaint  Patient presents with  . Wound Check     (Consider location/radiation/quality/duration/timing/severity/associated sxs/prior Treatment) Patient is a 34 y.o. female presenting with wound check. The history is provided by the patient. No language interpreter was used.  Wound Check This is a recurrent problem. The current episode started in the past 7 days. The problem occurs constantly. The problem has been gradually worsening. Pertinent negatives include no abdominal pain or fever. Nothing aggravates the symptoms. She has tried nothing for the symptoms. The treatment provided no relief.  Pt reports area of swelling has increased and now is going to groin and front of leg  History reviewed. No pertinent past medical history. History reviewed. No pertinent past surgical history. No family history on file. Social History  Substance Use Topics  . Smoking status: Current Every Day Smoker -- 0.50 packs/day    Types: Cigarettes  . Smokeless tobacco: None  . Alcohol Use: Yes     Comment: beer per day   OB History    No data available     Review of Systems  Constitutional: Negative for fever.  Gastrointestinal: Negative for abdominal pain.  All other systems reviewed and are negative.     Allergies  Review of patient's allergies indicates no known allergies.  Home Medications   Prior to Admission medications   Medication Sig Start Date End Date Taking? Authorizing Provider  clindamycin (CLEOCIN) 300 MG capsule Take 1 capsule (300 mg total) by mouth 4 (four) times daily. For 7 days 07/24/15   Tammy Triplett, PA-C  naproxen sodium (ANAPROX) 220 MG tablet Take 440 mg by mouth once as needed. For pain     Historical Provider, MD  sulfamethoxazole-trimethoprim (BACTRIM DS,SEPTRA DS) 800-160 MG tablet Take 1 tablet by mouth 2 (two) times daily.  07/27/15 08/03/15  Elson AreasLeslie K Caoimhe Damron, PA-C  triamcinolone cream (KENALOG) 0.1 % Apply 1 application topically 3 (three) times daily. 07/24/15   Tammy Triplett, PA-C   LMP 07/24/2015 Physical Exam  Constitutional: She is oriented to person, place, and time. She appears well-developed and well-nourished.  HENT:  Head: Normocephalic and atraumatic.  Eyes: EOM are normal.  Neck: Normal range of motion.  Cardiovascular: Normal rate and normal heart sounds.   Pulmonary/Chest: Effort normal.  Abdominal: She exhibits no distension.  Musculoskeletal: She exhibits tenderness.  Increased area of redness to right buttock,  New 12 cm area of redness anterior thigh,    Neurological: She is alert and oriented to person, place, and time.  Psychiatric: She has a normal mood and affect.  Nursing note and vitals reviewed.   ED Course  Procedures (including critical care time) Labs Review Labs Reviewed  CBC WITH DIFFERENTIAL/PLATELET  COMPREHENSIVE METABOLIC PANEL    Imaging Review No results found. I have personally reviewed and evaluated these images and lab results as part of my medical decision-making.   EKG Interpretation None      MDM  Pt has worsening cellulitis,  No obvious abscess.  Labs normal.  HIv and rpr pending.  I will consult hospitalist for admission/    Final diagnoses:  Cellulitis of right leg    Dr. Maryfrances Bunnelldanford in to see and examine pt.  He will admit for treatment.  Vancomycin ordered    Elson AreasLeslie K Rico Massar, PA-C 07/28/15 2004  Raeford RazorStephen Kohut, MD 07/28/15 [phone]

## 2015-07-29 DIAGNOSIS — F17200 Nicotine dependence, unspecified, uncomplicated: Secondary | ICD-10-CM

## 2015-07-29 DIAGNOSIS — L03115 Cellulitis of right lower limb: Principal | ICD-10-CM

## 2015-07-29 LAB — CBC
HCT: 36.5 % (ref 36.0–46.0)
Hemoglobin: 12.1 g/dL (ref 12.0–15.0)
MCH: 28.7 pg (ref 26.0–34.0)
MCHC: 33.2 g/dL (ref 30.0–36.0)
MCV: 86.5 fL (ref 78.0–100.0)
PLATELETS: 310 10*3/uL (ref 150–400)
RBC: 4.22 MIL/uL (ref 3.87–5.11)
RDW: 12.8 % (ref 11.5–15.5)
WBC: 4.9 10*3/uL (ref 4.0–10.5)

## 2015-07-29 LAB — RPR: RPR Ser Ql: NONREACTIVE

## 2015-07-29 LAB — HIV ANTIBODY (ROUTINE TESTING W REFLEX): HIV SCREEN 4TH GENERATION: NONREACTIVE

## 2015-07-29 MED ORDER — OXYCODONE-ACETAMINOPHEN 5-325 MG PO TABS
1.0000 | ORAL_TABLET | ORAL | Status: DC | PRN
  Administered 2015-07-29 – 2015-07-30 (×5): 1 via ORAL
  Filled 2015-07-29 (×5): qty 1

## 2015-07-29 NOTE — Progress Notes (Signed)
[facility] PROGRESS NOTE  Creig HinesLashonda P Wayne ZOX:[id number] DOB: 08/10/1981 DOA: 07/28/2015 PCP: No PCP Per Patient  Assessment/Plan: 1. Cellulitis of the right upper thigh. Patient presented with blistering lesions with associated cellulitis of right upper thigh and left ankle. Blisters have ruptured and it appears that surrounding cellulitis is slowly improving. She was treated with Clindamycin and Ceftriaxone as an outpatient without improvement. Continue IV Vancomycin for today and monitor. Likely transition to oral abx tomorrow. She is afebrile with a normal WBC. Will need outpatient dermatology follow up.  2. Tobacco abuse, counseled on the important of cessation.   Code Status: Full DVT prophylaxis: SCDs Family Communication: Discussed with patient who understands and has no concerns at this time. Disposition Plan: Anticipate discharge in 1-2 days.    Consultants:    Procedures:    Antibiotics:  Vancomycin 11/5>>  HPI/Subjective: Feels okay. States her rash is improving slightly however she still has pain and swelling to her right upper thigh.   Objective: Filed Vitals:   07/29/15 0601  BP: 168/77  Pulse: 58  Temp: 98 F (36.7 C)  Resp: 18    Intake/Output Summary (Last 24 hours) at 07/29/15 0717 Last data filed at 07/29/15 0601  Gross per 24 hour  Intake    240 ml  Output    250 ml  Net    -10 ml   Filed Weights   07/28/15 2100  Weight: 70.67 kg (155 lb 12.8 oz)    Exam:   General: NAD, looks comfortable  Cardiovascular: RRR, S1, S2   Respiratory: clear bilaterally, No wheezing, rales or rhonchi  Abdomen: soft, non tender, no distention , bowel sounds normal  Musculoskeletal:  Area of erythema on the right outer thigh that has been marked with black marker. Erythema does not appear to be extending beyond marked margins. Erythema over ruptured blister on left ankle appears to be mild.    Data Reviewed:  Basic Metabolic  Panel:  Recent Labs Lab 07/27/15 2037 07/28/15 1800  NA 135 138  K 3.5 3.8  CL 106 107  CO2 23 25  GLUCOSE 81 85  BUN 13 15  CREATININE 0.78 0.76  CALCIUM 8.7* 9.0   Liver Function Tests:  Recent Labs Lab 07/27/15 2037 07/28/15 1800  AST 18 17  ALT 8* 15  ALKPHOS 42 50  BILITOT 0.4 0.5  PROT 8.0 7.7  ALBUMIN 4.2 4.1   CBC:  Recent Labs Lab 07/27/15 2037 07/28/15 1800  WBC 6.2 7.2  NEUTROABS 3.6 4.4  HGB 12.5 12.3  HCT 37.5 36.7  MCV 86.2 85.3  PLT 314 341     Recent Results (from the past 240 hour(s))  MRSA PCR Screening     Status: None   Collection Time: 07/28/15  8:42 PM  Result Value Ref Range Status   MRSA by PCR NEGATIVE NEGATIVE Final    Comment:        The GeneXpert MRSA Assay (FDA approved for NASAL specimens only), is one component of a comprehensive MRSA colonization surveillance program. It is not intended to diagnose MRSA infection nor to guide or monitor treatment for MRSA infections.      Studies: No results found.  Scheduled Meds: . nicotine  14 mg Transdermal Daily  . vancomycin  1,000 mg Intravenous Q12H   Continuous Infusions:   Principal Problem:   Cellulitis Active Problems:   Cellulitis of right leg   Time spent: 20 minutes   Jehanzeb Memon. MD [facility] Pager [phone].  If 7PM-7AM, please contact night-coverage at [email or web address], password [facility] 07/29/2015, 7:17 AM  LOS: 1 day     By signing my name below, I, Burnett Harry, attest that this documentation has been prepared under the direction and in the presence of [facility]. MD Electronically Signed: Burnett Harry, Scribe. 07/29/2015 11:09am  I, Dr. Erick Blinks, personally performed the services described in this documentaiton. All medical record entries made by the scribe were at my direction and in my presence. I have reviewed the chart and agree that the record reflects my personal performance and is accurate and complete  Erick Blinks, MD, 07/29/2015 11:24 AM

## 2015-07-29 NOTE — Progress Notes (Signed)
Patient took shower.  Requested dressing to right back of thigh to cover open blisters X 2 and one intact blister.  Placed telfa, covered with ABD and secured with medipore.  Tolerated well.

## 2015-07-30 DIAGNOSIS — Z720 Tobacco use: Secondary | ICD-10-CM

## 2015-07-30 LAB — BASIC METABOLIC PANEL
Anion gap: 6 (ref 5–15)
BUN: 11 mg/dL (ref 6–20)
CHLORIDE: 107 mmol/L (ref 101–111)
CO2: 23 mmol/L (ref 22–32)
CREATININE: 0.7 mg/dL (ref 0.44–1.00)
Calcium: 8.4 mg/dL — ABNORMAL LOW (ref 8.9–10.3)
GFR calc Af Amer: >60 mL/min (ref >60)
GFR calc non Af Amer: >60 mL/min (ref >60)
Glucose, Bld: 101 mg/dL — ABNORMAL HIGH (ref 65–99)
Potassium: 4.1 mmol/L (ref 3.5–5.1)
Sodium: 136 mmol/L (ref 135–145)

## 2015-07-30 MED ORDER — OXYCODONE-ACETAMINOPHEN 5-325 MG PO TABS: 1.0000 | ORAL_TABLET | ORAL | Status: DC | PRN

## 2015-07-30 MED ORDER — DOXYCYCLINE HYCLATE 100 MG PO TABS: 100.0000 mg | ORAL_TABLET | Freq: Two times a day (BID) | ORAL | Status: DC

## 2015-07-30 NOTE — Discharge Summary (Signed)
Physician Discharge Summary  Caroline Barnes GNF:[id number] DOB: 03-05-1981 DOA: 07/28/2015  PCP: No PCP Per Patient  Admit date: 07/28/2015 Discharge date: 07/30/2015  Time spent: 35 minutes  Recommendations for Outpatient Follow-up:  1. Follow up with outpatient dermatology to evaluate recurrent blisters.    Discharge Diagnoses:  Principal Problem:   Cellulitis Active Problems:   Cellulitis of right leg   Tobacco use  Discharge Condition: Improved   Diet recommendation: Regular diet   Filed Weights   07/28/15 2100  Weight: 70.67 kg (155 lb 12.8 oz)    History of present illness:  36 yof with complaints of worsening rash. She recently failed outpatient Clindamycin and Ceftriaxone. While in the ED, she was noted to have a large area of redness and swelling and induration in the right buttock around the ruptured bullae that had spread in the interim to the inner thigh. Admitted for further evaluation and treatment.   Hospital Course:  Patient was found to have a large blister on the right upper thigh with surrounding cellulitis. She was started on IV Vancomycin with improvement. She is afebrile and has no leukocytosis. Upon discharge transitioned to oral Doxycycline and continued dressings on wounds. Since patient has been having recurrent blisters and lesions of unclear etiology, she will be referred to dermatology on discharge. She is otherwise stable for discharge.   1. Tobacco abuse, counseled on the important of cessation.  Procedures:  None   Consultations:  None   Discharge Exam: Filed Vitals:   07/30/15 0700  BP: 140/90  Pulse: 67  Temp: 98.7 F (37.1 C)  Resp: 18    1. General: NAD. Appears comfortable and calm in the bed.  2. Cardiovascular: Regular rate and rhythm. No murmur, gallops, or rub.   3. Respiratory: Clear bilaterally. No wheezing, rales or rhonchi. 4. Abdomen: soft, non tender, no distention , bowel sounds normal 5. Musculoskeletal:  Large ruptured blisters, with surrounding erythema that has improved. Overall edema in the area has also improved.   Discharge Instructions    Current Discharge Medication List    START taking these medications   Details  doxycycline (VIBRA-TABS) 100 MG tablet Take 1 tablet (100 mg total) by mouth 2 (two) times daily. Qty: 10 tablet, Refills: 0    oxyCODONE-acetaminophen (PERCOCET/ROXICET) 5-325 MG tablet Take 1 tablet by mouth every 4 (four) hours as needed for severe pain. Qty: 30 tablet, Refills: 0       No Known Allergies    The results of significant diagnostics from this hospitalization (including imaging, microbiology, ancillary and laboratory) are listed below for reference.    Significant Diagnostic Studies: No results found.  Microbiology: Recent Results (from the past 240 hour(s))  MRSA PCR Screening     Status: None   Collection Time: 07/28/15  8:42 PM  Result Value Ref Range Status   MRSA by PCR NEGATIVE NEGATIVE Final    Comment:        The GeneXpert MRSA Assay (FDA approved for NASAL specimens only), is one component of a comprehensive MRSA colonization surveillance program. It is not intended to diagnose MRSA infection nor to guide or monitor treatment for MRSA infections.      Labs: Basic Metabolic Panel:  Recent Labs Lab 07/27/15 2037 07/28/15 1800 07/30/15 0509  NA 135 138 136  K 3.5 3.8 4.1  CL 106 107 107  CO2 GLUCOSE 81 85 101*  BUN CREATININE 0.78 0.76 0.70  CALCIUM  8.7* 9.0 8.4*   Liver Function Tests:  Recent Labs Lab 07/27/15 2037 07/28/15 1800  AST 18 17  ALT 8* 15  ALKPHOS 42 50  BILITOT 0.4 0.5  PROT 8.0 7.7  ALBUMIN 4.2 4.1  CBC:  Recent Labs Lab 07/27/15 2037 07/28/15 1800 07/29/15 0642  WBC 6.2 7.2 4.9  NEUTROABS 3.6 4.4  --   HGB 12.5 12.3 12.1  HCT 37.5 36.7 36.5  MCV 86.2 85.3 86.5  PLT 314 341 310     Signed:  Erick BlinksMemon, Won Kreuzer, MD  [facility] 07/30/2015, 8:04  AM   By signing my name below, I, Zadie CleverlyJessica Augustus, attest that this documentation has been prepared under the direction and in the presence of Erick BlinksJehanzeb Ercole Georg, MD. Electronically signed: Zadie CleverlyJessica Augustus, Scribe. 07/30/2015 8:55am  I, Dr. Erick BlinksJehanzeb Austen Oyster, personally performed the services described in this documentaiton. All medical record entries made by the scribe were at my direction and in my presence. I have reviewed the chart and agree that the record reflects my personal performance and is accurate and complete  Erick BlinksJehanzeb Kodi Guerrera, MD, 07/30/2015 9:15 AM

## 2015-07-30 NOTE — Care Management Note (Signed)
Case Management Note  Patient Details  Name: Caroline Barnes MRN: [id number] Date of Birth: 11/17/1980  Subjective/Objective:                  Pt from home, ind at baseline. Pt admitted with cellulitis. Pt has insurance but no PCP.   Action/Plan: Pt discharging home today with self care. Pt given list of PCP's in area accepting new pt's. Pt made f/u appointment with dermatology. Pt has no further CM needs.   Expected Discharge Date:     07/30/2015             Expected Discharge Plan:  Home/Self Care  In-House Referral:  NA  Discharge planning Services  CM Consult  Post Acute Care Choice:  NA Choice offered to:  NA  DME Arranged:    DME Agency:     HH Arranged:    HH Agency:     Status of Service:  Completed, signed off  Medicare Important Message Given:    Date Medicare IM Given:    Medicare IM give by:    Date Additional Medicare IM Given:    Additional Medicare Important Message give by:     If discussed at Long Length of Stay Meetings, dates discussed:    Additional Comments:  Caroline Barnes, Caroline Niemczyk Demske, RN 07/30/2015, 11:36 AM

## 2015-07-30 NOTE — Final Progress Note (Signed)
Patient discharged with instructions, prescription, and care notes.  Verbalized understanding via teach back.  IV was removed and the site was WNL. Patient voiced no further complaints or concerns at the time of discharge.  Appointments scheduled per instructions.  Patient left the floor via w/c with staff and family in stable condition. 

## 2015-09-26 ENCOUNTER — Other Ambulatory Visit: Payer: Self-pay | Admitting: Obstetrics & Gynecology

## 2015-10-02 ENCOUNTER — Other Ambulatory Visit (HOSPITAL_COMMUNITY)
Admission: RE | Admit: 2015-10-02 | Discharge: 2015-10-02 | Disposition: A | Discharge status: Home / Self Care | Payer: BLUE CROSS/BLUE SHIELD | Source: Ambulatory Visit | Attending: Obstetrics & Gynecology | Admitting: Obstetrics & Gynecology

## 2015-10-02 ENCOUNTER — Encounter: Payer: Self-pay | Admitting: Obstetrics & Gynecology

## 2015-10-02 ENCOUNTER — Ambulatory Visit (INDEPENDENT_AMBULATORY_CARE_PROVIDER_SITE_OTHER): Payer: BLUE CROSS/BLUE SHIELD | Admitting: Obstetrics & Gynecology

## 2015-10-02 VITALS — BP 120/70 | HR 76 | Ht 66.0 in | Wt 165.0 lb

## 2015-10-02 DIAGNOSIS — Z01419 Encounter for gynecological examination (general) (routine) without abnormal findings: Secondary | ICD-10-CM | POA: Diagnosis not present

## 2015-10-02 DIAGNOSIS — Z1151 Encounter for screening for human papillomavirus (HPV): Secondary | ICD-10-CM | POA: Diagnosis present

## 2015-10-02 DIAGNOSIS — Z01411 Encounter for gynecological examination (general) (routine) with abnormal findings: Secondary | ICD-10-CM | POA: Insufficient documentation

## 2015-10-02 MED ORDER — KETOROLAC TROMETHAMINE 10 MG PO TABS: 10.0000 mg | ORAL_TABLET | Freq: Three times a day (TID) | ORAL | Status: DC | PRN

## 2015-10-02 MED ORDER — DESOGESTREL-ETHINYL ESTRADIOL 0.15-30 MG-MCG PO TABS: 1.0000 | ORAL_TABLET | Freq: Every day | ORAL | Status: DC

## 2015-10-02 NOTE — Progress Notes (Signed)
Patient ID: Caroline Barnes, female   DOB: 1981/05/28, 35 y.o.   MRN: [id number] Chief Complaint  Patient presents with  . pap/physcial   Subjective:     Caroline Barnes is a 35 y.o. female here for a routine exam.  Patient's last menstrual period was 09/16/2015. No obstetric history on file. Birth Control Method:  none Menstrual Calendar(currently): regular  Current complaints: heavy painful periods.   Current acute medical issues:  none   Recent Gynecologic History Patient's last menstrual period was 09/16/2015. Last Pap: ?,   Last mammogram: ,    History reviewed. No pertinent past medical history.  History reviewed. No pertinent past surgical history.  OB History    No data available      Social History   Social History  . Marital Status: Single    Spouse Name: N/A  . Number of Children: N/A  . Years of Education: N/A   Social History Main Topics  . Smoking status: Current Every Day Smoker -- 0.50 packs/day    Types: Cigarettes  . Smokeless tobacco: None  . Alcohol Use: Yes  . Drug Use: No  . Sexual Activity: No   Other Topics Concern  . None   Social History Narrative    Family History  Problem Relation Age of Onset  . Rashes / Skin problems Cousin     Contact dermatitis  . Thyroid cancer Mother   . Diabetes Mother     No current outpatient prescriptions on file.  Review of Systems  Review of Systems  Constitutional: Negative for fever, chills, weight loss, malaise/fatigue and diaphoresis.  HENT: Negative for hearing loss, ear pain, nosebleeds, congestion, sore throat, neck pain, tinnitus and ear discharge.   Eyes: Negative for blurred vision, double vision, photophobia, pain, discharge and redness.  Respiratory: Negative for cough, hemoptysis, sputum production, shortness of breath, wheezing and stridor.   Cardiovascular: Negative for chest pain, palpitations, orthopnea, claudication, leg swelling and PND.  Gastrointestinal: negative  for abdominal pain. Negative for heartburn, nausea, vomiting, diarrhea, constipation, blood in stool and melena.  Genitourinary: Negative for dysuria, urgency, frequency, hematuria and flank pain.  Musculoskeletal: Negative for myalgias, back pain, joint pain and falls.  Skin: Negative for itching and rash.  Neurological: Negative for dizziness, tingling, tremors, sensory change, speech change, focal weakness, seizures, loss of consciousness, weakness and headaches.  Endo/Heme/Allergies: Negative for environmental allergies and polydipsia. Does not bruise/bleed easily.  Psychiatric/Behavioral: Negative for depression, suicidal ideas, hallucinations, memory loss and substance abuse. The patient is not nervous/anxious and does not have insomnia.        Objective:  Blood pressure 120/70, pulse 76, height 5\' 6"  (1.676 m), weight 165 lb (74.844 kg), last menstrual period 09/16/2015.   Physical Exam  Vitals reviewed. Constitutional: She is oriented to person, place, and time. She appears well-developed and well-nourished.  HENT:  Head: Normocephalic and atraumatic.        Right Ear: External ear normal.  Left Ear: External ear normal.  Nose: Nose normal.  Mouth/Throat: Oropharynx is clear and moist.  Eyes: Conjunctivae and EOM are normal. Pupils are equal, round, and reactive to light. Right eye exhibits no discharge. Left eye exhibits no discharge. No scleral icterus.  Neck: Normal range of motion. Neck supple. No tracheal deviation present. No thyromegaly present.  Cardiovascular: Normal rate, regular rhythm, normal heart sounds and intact distal pulses.  Exam reveals no gallop and no friction rub.   No murmur heard. Respiratory: Effort normal and breath  sounds normal. No respiratory distress. She has no wheezes. She has no rales. She exhibits no tenderness.  GI: Soft. Bowel sounds are normal. She exhibits no distension and no mass. There is no tenderness. There is no rebound and no guarding.   Genitourinary:  Breasts no masses skin changes or nipple changes bilaterally      Vulva is normal without lesions Vagina is pink moist without discharge Cervix normal in appearance and pap is done Uterus is normal size shape and contour Adnexa is negative with normal sized ovaries   Musculoskeletal: Normal range of motion. She exhibits no edema and no tenderness.  Neurological: She is alert and oriented to person, place, and time. She has normal reflexes. She displays normal reflexes. No cranial nerve deficit. She exhibits normal muscle tone. Coordination normal.  Skin: Skin is warm and dry. No rash noted. No erythema. No pallor.  Psychiatric: She has a normal mood and affect. Her behavior is normal. Judgment and thought content normal.       Assessment:    Healthy female exam.    Plan:    Follow up in: 1 year. OCP + toradol for dysmenorrhea

## 2015-10-05 LAB — CYTOLOGY - PAP

## 2015-11-07 ENCOUNTER — Other Ambulatory Visit: Payer: Self-pay | Admitting: Obstetrics & Gynecology

## 2015-11-13 ENCOUNTER — Telehealth: Payer: Self-pay | Admitting: Obstetrics & Gynecology

## 2015-12-06 ENCOUNTER — Ambulatory Visit: Payer: BLUE CROSS/BLUE SHIELD | Admitting: Obstetrics & Gynecology

## 2015-12-13 ENCOUNTER — Ambulatory Visit (INDEPENDENT_AMBULATORY_CARE_PROVIDER_SITE_OTHER): Payer: BLUE CROSS/BLUE SHIELD | Admitting: Obstetrics & Gynecology

## 2015-12-13 ENCOUNTER — Encounter: Payer: Self-pay | Admitting: Obstetrics & Gynecology

## 2015-12-13 VITALS — BP 140/100 | HR 76 | Ht 66.0 in | Wt 163.0 lb

## 2015-12-13 DIAGNOSIS — N926 Irregular menstruation, unspecified: Secondary | ICD-10-CM

## 2015-12-13 DIAGNOSIS — N920 Excessive and frequent menstruation with regular cycle: Secondary | ICD-10-CM

## 2015-12-13 DIAGNOSIS — D500 Iron deficiency anemia secondary to blood loss (chronic): Secondary | ICD-10-CM | POA: Diagnosis not present

## 2015-12-13 LAB — POCT HEMOGLOBIN: Hemoglobin: 10.3 g/dL — AB (ref 12.2–16.2)

## 2015-12-13 MED ORDER — MEGESTROL ACETATE 40 MG PO TABS: ORAL_TABLET | ORAL | Status: DC

## 2015-12-13 MED ORDER — KETOROLAC TROMETHAMINE 10 MG PO TABS: 10.0000 mg | ORAL_TABLET | Freq: Three times a day (TID) | ORAL | Status: DC | PRN

## 2015-12-13 MED ORDER — NORETHIN-ETH ESTRAD-FE BIPHAS 1 MG-10 MCG / 10 MCG PO TABS: 1.0000 | ORAL_TABLET | Freq: Every day | ORAL | Status: DC

## 2016-02-03 ENCOUNTER — Encounter (HOSPITAL_COMMUNITY): Payer: Self-pay | Admitting: Emergency Medicine

## 2016-02-03 DIAGNOSIS — R238 Other skin changes: Secondary | ICD-10-CM | POA: Insufficient documentation

## 2016-02-03 DIAGNOSIS — F17210 Nicotine dependence, cigarettes, uncomplicated: Secondary | ICD-10-CM | POA: Insufficient documentation

## 2016-02-03 DIAGNOSIS — M7989 Other specified soft tissue disorders: Secondary | ICD-10-CM | POA: Insufficient documentation

## 2016-02-03 MED ORDER — IBUPROFEN 400 MG PO TABS
400.0000 mg | ORAL_TABLET | Freq: Once | ORAL | Status: AC
  Administered 2016-02-03: 400 mg via ORAL
  Filled 2016-02-03: qty 1

## 2016-02-03 NOTE — ED Notes (Signed)
Patient states she woke up this morning with a small place on her right ring finger. States as day went on the place on her finger has turned into a large blister and now her right hand is swelling. She states history of cellulitis. Denies n/v/d.

## 2016-02-03 NOTE — Progress Notes (Signed)
Patient ID: Caroline Barnes, female   DOB: 06/22/1981, 35 y.o.   MRN: [id number]      Chief Complaint  Patient presents with  . gyn visit    period are lasting longer, started 11-28-15 and still on.    Blood pressure 140/100, pulse 76, height 5\' 6"  (1.676 m), weight 163 lb (73.936 kg), last menstrual period 11/28/2015.  35 y.o. No obstetric history on file. Patient's last menstrual period was 11/28/2015. The current method of family planning is none.  Subjective Menses heaqvy and lasting long time Painful Not normal Needs BCM  Objective Vulva:  normal appearing vulva with no masses, tenderness or lesions Vagina:  normal mucosa, no discharge Cervix:  no cervical motion tenderness and no lesions Uterus:  normal size, contour, position, consistency, mobility, non-tender Adnexa: ovaries:present,  normal adnexa in size, nontender and no masses    Pertinent ROS No burning with urination, frequency or urgency No nausea, vomiting or diarrhea Nor fever chills or other constitutional symptoms   Labs or studies Hemoglobin 10.3    Impression Diagnoses this Encounter::   ICD-9-CM ICD-10-CM   1. Abnormal menstrual periods 626.2 N92.6 POCT hemoglobin    Established relevant diagnosis(es):   Plan/Recommendations: Meds ordered this encounter  Medications  . megestrol (MEGACE) 40 MG tablet    Sig: 3 tablets a day for 5 days, 2 tablets a day for 5 days then 1 tablet daily    Dispense:  45 tablet    Refill:  3  . Norethindrone-Ethinyl Estradiol-Fe Biphas (LO LOESTRIN FE) 1 MG-10 MCG / 10 MCG tablet    Sig: Take 1 tablet by mouth daily.    Dispense:  1 Package    Refill:  11  . ketorolac (TORADOL) 10 MG tablet    Sig: Take 1 tablet (10 mg total) by mouth every 8 (eight) hours as needed.    Dispense:  15 tablet    Refill:  0    Labs or Scans Ordered: Orders Placed This Encounter  Procedures  . POCT hemoglobin    Management:: Megestrol for a month to manage  cycles then begin ocp for bcm  Follow up No Follow-up on file.    All questions were answered.

## 2016-02-04 ENCOUNTER — Emergency Department (HOSPITAL_COMMUNITY)
Admission: EM | Admit: 2016-02-04 | Discharge: 2016-02-04 | Disposition: A | Discharge status: Home / Self Care | Payer: BLUE CROSS/BLUE SHIELD | Attending: Emergency Medicine | Admitting: Emergency Medicine

## 2016-02-04 DIAGNOSIS — M7989 Other specified soft tissue disorders: Secondary | ICD-10-CM

## 2016-02-04 DIAGNOSIS — R238 Other skin changes: Secondary | ICD-10-CM

## 2016-02-04 HISTORY — DX: Cellulitis, unspecified: L03.90

## 2016-02-04 MED ORDER — OXYCODONE-ACETAMINOPHEN 5-325 MG PO TABS
1.0000 | ORAL_TABLET | Freq: Once | ORAL | Status: AC
  Administered 2016-02-04: 1 via ORAL
  Filled 2016-02-04: qty 1

## 2016-02-04 MED ORDER — CEPHALEXIN 500 MG PO CAPS
500.0000 mg | ORAL_CAPSULE | Freq: Once | ORAL | Status: AC
  Administered 2016-02-04: 500 mg via ORAL
  Filled 2016-02-04: qty 1

## 2016-02-04 MED ORDER — OXYCODONE-ACETAMINOPHEN 5-325 MG PO TABS: 1.0000 | ORAL_TABLET | ORAL | Status: DC | PRN

## 2016-02-04 MED ORDER — PREDNISONE 50 MG PO TABS
60.0000 mg | ORAL_TABLET | Freq: Once | ORAL | Status: AC
  Administered 2016-02-04: 60 mg via ORAL
  Filled 2016-02-04: qty 1

## 2016-02-04 MED ORDER — CEPHALEXIN 500 MG PO CAPS: 500.0000 mg | ORAL_CAPSULE | Freq: Four times a day (QID) | ORAL | Status: DC

## 2016-02-04 MED ORDER — PREDNISONE 50 MG PO TABS: 50.0000 mg | ORAL_TABLET | Freq: Every day | ORAL | Status: DC

## 2016-02-04 MED FILL — Oxycodone w/ Acetaminophen Tab 5-325 MG: ORAL | Qty: 6 | Status: AC

## 2016-02-04 NOTE — Discharge Instructions (Signed)
It is not clear what is causing the swelling in your hand, but I believe it is an inflammatory reaction, possibly an allergy. You're being given some antibiotics just in case there is some underlying infection. Please make sure to follow up with your primary care of either in the next 1-2 days, return to the emergency department if symptoms are worsening or if you are not able to get into see your physician.  Cephalexin tablets or capsules What is this medicine? CEPHALEXIN (sef a LEX in) is a cephalosporin antibiotic. It is used to treat certain kinds of bacterial infections It will not work for colds, flu, or other viral infections. This medicine may be used for other purposes; ask your health care provider or pharmacist if you have questions. What should I tell my health care provider before I take this medicine? They need to know if you have any of these conditions: -kidney disease -stomach or intestine problems, especially colitis -an unusual or allergic reaction to cephalexin, other cephalosporins, penicillins, other antibiotics, medicines, foods, dyes or preservatives -pregnant or trying to get pregnant -breast-feeding How should I use this medicine? Take this medicine by mouth with a full glass of water. Follow the directions on the prescription label. This medicine can be taken with or without food. Take your medicine at regular intervals. Do not take your medicine more often than directed. Take all of your medicine as directed even if you think you are better. Do not skip doses or stop your medicine early. Talk to your pediatrician regarding the use of this medicine in children. While this drug may be prescribed for selected conditions, precautions do apply. Overdosage: If you think you have taken too much of this medicine contact a poison control center or emergency room at once. NOTE: This medicine is only for you. Do not share this medicine with others. What if I miss a dose? If you  miss a dose, take it as soon as you can. If it is almost time for your next dose, take only that dose. Do not take double or extra doses. There should be at least 4 to 6 hours between doses. What may interact with this medicine? -probenecid -some other antibiotics This list may not describe all possible interactions. Give your health care provider a list of all the medicines, herbs, non-prescription drugs, or dietary supplements you use. Also tell them if you smoke, drink alcohol, or use illegal drugs. Some items may interact with your medicine. What should I watch for while using this medicine? Tell your doctor or health care professional if your symptoms do not begin to improve in a few days. Do not treat diarrhea with over the counter products. Contact your doctor if you have diarrhea that lasts more than 2 days or if it is severe and watery. If you have diabetes, you may get a false-positive result for sugar in your urine. Check with your doctor or health care professional. What side effects may I notice from receiving this medicine? Side effects that you should report to your doctor or health care professional as soon as possible: -allergic reactions like skin rash, itching or hives, swelling of the face, lips, or tongue -breathing problems -pain or trouble passing urine -redness, blistering, peeling or loosening of the skin, including inside the mouth -severe or watery diarrhea -unusually weak or tired -yellowing of the eyes, skin Side effects that usually do not require medical attention (report to your doctor or health care professional if they continue or  are bothersome): -gas or heartburn -genital or anal irritation -headache -joint or muscle pain -nausea, vomiting This list may not describe all possible side effects. Call your doctor for medical advice about side effects. You may report side effects to FDA at 1-800-FDA-1088. Where should I keep my medicine? Keep out of the reach  of children. Store at room temperature between 59 and 86 degrees F (15 and 30 degrees C). Throw away any unused medicine after the expiration date. NOTE: This sheet is a summary. It may not cover all possible information. If you have questions about this medicine, talk to your doctor, pharmacist, or health care provider.    2016, [employer]vier/Gold Standard. (2007-12-13 17:09:13)  Prednisone tablets What is this medicine? PREDNISONE (PRED ni sone) is a corticosteroid. It is commonly used to treat inflammation of the skin, joints, lungs, and other organs. Common conditions treated include asthma, allergies, and arthritis. It is also used for other conditions, such as blood disorders and diseases of the adrenal glands. This medicine may be used for other purposes; ask your health care provider or pharmacist if you have questions. What should I tell my health care provider before I take this medicine? They need to know if you have any of these conditions: -Cushing's syndrome -diabetes -glaucoma -heart disease -high blood pressure -infection (especially a virus infection such as chickenpox, cold sores, or herpes) -kidney disease -liver disease -mental illness -myasthenia gravis -osteoporosis -seizures -stomach or intestine problems -thyroid disease -an unusual or allergic reaction to lactose, prednisone, other medicines, foods, dyes, or preservatives -pregnant or trying to get pregnant -breast-feeding How should I use this medicine? Take this medicine by mouth with a glass of water. Follow the directions on the prescription label. Take this medicine with food. If you are taking this medicine once a day, take it in the morning. Do not take more medicine than you are told to take. Do not suddenly stop taking your medicine because you may develop a severe reaction. Your doctor will tell you how much medicine to take. If your doctor wants you to stop the medicine, the dose may be slowly lowered  over time to avoid any side effects. Talk to your pediatrician regarding the use of this medicine in children. Special care may be needed. Overdosage: If you think you have taken too much of this medicine contact a poison control center or emergency room at once. NOTE: This medicine is only for you. Do not share this medicine with others. What if I miss a dose? If you miss a dose, take it as soon as you can. If it is almost time for your next dose, talk to your doctor or health care professional. You may need to miss a dose or take an extra dose. Do not take double or extra doses without advice. What may interact with this medicine? Do not take this medicine with any of the following medications: -metyrapone -mifepristone This medicine may also interact with the following medications: -aminoglutethimide -amphotericin B -aspirin and aspirin-like medicines -barbiturates -certain medicines for diabetes, like glipizide or glyburide -cholestyramine -cholinesterase inhibitors -cyclosporine -digoxin -diuretics -ephedrine -female hormones, like estrogens and birth control pills -isoniazid -ketoconazole -NSAIDS, medicines for pain and inflammation, like ibuprofen or naproxen -phenytoin -rifampin -toxoids -vaccines -warfarin This list may not describe all possible interactions. Give your health care provider a list of all the medicines, herbs, non-prescription drugs, or dietary supplements you use. Also tell them if you smoke, drink alcohol, or use illegal drugs. Some items may  interact with your medicine. What should I watch for while using this medicine? Visit your doctor or health care professional for regular checks on your progress. If you are taking this medicine over a prolonged period, carry an identification card with your name and address, the type and dose of your medicine, and your doctor's name and address. This medicine may increase your risk of getting an infection. Tell your  doctor or health care professional if you are around anyone with measles or chickenpox, or if you develop sores or blisters that do not heal properly. If you are going to have surgery, tell your doctor or health care professional that you have taken this medicine within the last twelve months. Ask your doctor or health care professional about your diet. You may need to lower the amount of salt you eat. This medicine may affect blood sugar levels. If you have diabetes, check with your doctor or health care professional before you change your diet or the dose of your diabetic medicine. What side effects may I notice from receiving this medicine? Side effects that you should report to your doctor or health care professional as soon as possible: -allergic reactions like skin rash, itching or hives, swelling of the face, lips, or tongue -changes in emotions or moods -changes in vision -depressed mood -eye pain -fever or chills, cough, sore throat, pain or difficulty passing urine -increased thirst -swelling of ankles, feet Side effects that usually do not require medical attention (report to your doctor or health care professional if they continue or are bothersome): -confusion, excitement, restlessness -headache -nausea, vomiting -skin problems, acne, thin and shiny skin -trouble sleeping -weight gain This list may not describe all possible side effects. Call your doctor for medical advice about side effects. You may report side effects to FDA at 1-800-FDA-1088. Where should I keep my medicine? Keep out of the reach of children. Store at room temperature between 15 and 30 degrees C (59 and 86 degrees F). Protect from light. Keep container tightly closed. Throw away any unused medicine after the expiration date. NOTE: This sheet is a summary. It may not cover all possible information. If you have questions about this medicine, talk to your doctor, pharmacist, or health care provider.    2016,  [employer]/Gold Standard. (2011-04-24 10:57:14)  Acetaminophen; Oxycodone tablets What is this medicine? ACETAMINOPHEN; OXYCODONE (a set a MEE noe fen; ox i KOE done) is a pain reliever. It is used to treat moderate to severe pain. This medicine may be used for other purposes; ask your health care provider or pharmacist if you have questions. What should I tell my health care provider before I take this medicine? They need to know if you have any of these conditions: -brain tumor -Crohn's disease, inflammatory bowel disease, or ulcerative colitis -drug abuse or addiction -head injury -heart or circulation problems -if you often drink alcohol -kidney disease or problems going to the bathroom -liver disease -lung disease, asthma, or breathing problems -an unusual or allergic reaction to acetaminophen, oxycodone, other opioid analgesics, other medicines, foods, dyes, or preservatives -pregnant or trying to get pregnant -breast-feeding How should I use this medicine? Take this medicine by mouth with a full glass of water. Follow the directions on the prescription label. You can take it with or without food. If it upsets your stomach, take it with food. Take your medicine at regular intervals. Do not take it more often than directed. Talk to your pediatrician regarding the use of this medicine  in children. Special care may be needed. Patients over 42 years old may have a stronger reaction and need a smaller dose. Overdosage: If you think you have taken too much of this medicine contact a poison control center or emergency room at once. NOTE: This medicine is only for you. Do not share this medicine with others. What if I miss a dose? If you miss a dose, take it as soon as you can. If it is almost time for your next dose, take only that dose. Do not take double or extra doses. What may interact with this medicine? -alcohol -antihistamines -barbiturates like amobarbital, butalbital,  butabarbital, methohexital, pentobarbital, phenobarbital, thiopental, and secobarbital -benztropine -drugs for bladder problems like solifenacin, trospium, oxybutynin, tolterodine, hyoscyamine, and methscopolamine -drugs for breathing problems like ipratropium and tiotropium -drugs for certain stomach or intestine problems like propantheline, homatropine methylbromide, glycopyrrolate, atropine, belladonna, and dicyclomine -general anesthetics like etomidate, ketamine, nitrous oxide, propofol, desflurane, enflurane, halothane, isoflurane, and sevoflurane -medicines for depression, anxiety, or psychotic disturbances -medicines for sleep -muscle relaxants -naltrexone -narcotic medicines (opiates) for pain -phenothiazines like perphenazine, thioridazine, chlorpromazine, mesoridazine, fluphenazine, prochlorperazine, promazine, and trifluoperazine -scopolamine -tramadol -trihexyphenidyl This list may not describe all possible interactions. Give your health care provider a list of all the medicines, herbs, non-prescription drugs, or dietary supplements you use. Also tell them if you smoke, drink alcohol, or use illegal drugs. Some items may interact with your medicine. What should I watch for while using this medicine? Tell your doctor or health care professional if your pain does not go away, if it gets worse, or if you have new or a different type of pain. You may develop tolerance to the medicine. Tolerance means that you will need a higher dose of the medication for pain relief. Tolerance is normal and is expected if you take this medicine for a long time. Do not suddenly stop taking your medicine because you may develop a severe reaction. Your body becomes used to the medicine. This does NOT mean you are addicted. Addiction is a behavior related to getting and using a drug for a non-medical reason. If you have pain, you have a medical reason to take pain medicine. Your doctor will tell you how much  medicine to take. If your doctor wants you to stop the medicine, the dose will be slowly lowered over time to avoid any side effects. You may get drowsy or dizzy. Do not drive, use machinery, or do anything that needs mental alertness until you know how this medicine affects you. Do not stand or sit up quickly, especially if you are an older patient. This reduces the risk of dizzy or fainting spells. Alcohol may interfere with the effect of this medicine. Avoid alcoholic drinks. There are different types of narcotic medicines (opiates) for pain. If you take more than one type at the same time, you may have more side effects. Give your health care provider a list of all medicines you use. Your doctor will tell you how much medicine to take. Do not take more medicine than directed. Call emergency for help if you have problems breathing. The medicine will cause constipation. Try to have a bowel movement at least every 2 to 3 days. If you do not have a bowel movement for 3 days, call your doctor or health care professional. Do not take Tylenol (acetaminophen) or medicines that have acetaminophen with this medicine. Too much acetaminophen can be very dangerous. Many nonprescription medicines contain acetaminophen. Always read the labels  carefully to avoid taking more acetaminophen. What side effects may I notice from receiving this medicine? Side effects that you should report to your doctor or health care professional as soon as possible: -allergic reactions like skin rash, itching or hives, swelling of the face, lips, or tongue -breathing difficulties, wheezing -confusion -light headedness or fainting spells -severe stomach pain -unusually weak or tired -yellowing of the skin or the whites of the eyes Side effects that usually do not require medical attention (report to your doctor or health care professional if they continue or are bothersome): -dizziness -drowsiness -nausea -vomiting This list may  not describe all possible side effects. Call your doctor for medical advice about side effects. You may report side effects to FDA at 1-800-FDA-1088. Where should I keep my medicine? Keep out of the reach of children. This medicine can be abused. Keep your medicine in a safe place to protect it from theft. Do not share this medicine with anyone. Selling or giving away this medicine is dangerous and against the law. This medicine may cause accidental overdose and death if it taken by other adults, children, or pets. Mix any unused medicine with a substance like cat litter or coffee grounds. Then throw the medicine away in a sealed container like a sealed bag or a coffee can with a lid. Do not use the medicine after the expiration date. Store at room temperature between 20 and 25 degrees C (68 and 77 degrees F). NOTE: This sheet is a summary. It may not cover all possible information. If you have questions about this medicine, talk to your doctor, pharmacist, or health care provider.    2016, [employer]/Gold Standard. (2014-08-09 15:18:46)

## 2016-02-04 NOTE — ED Provider Notes (Signed)
CSN: [id number]     Arrival date & time 02/03/16  2240 History   First MD Initiated Contact with Patient 02/04/16 0157     Chief Complaint  Patient presents with  . Hand Pain     (Consider location/radiation/quality/duration/timing/severity/associated sxs/prior Treatment) Patient is a 35 y.o. female presenting with hand pain. The history is provided by the patient.  Hand Pain  She had been treated in the hospital for cellulitis last November. She woke up this morning and noted a blister on her right fifth finger. This is been getting larger and more painful as a day is gone on. She now notes that her entire right hand is swollen. She rates pain at 10/10. She denies fever, chills, sweats. Is any unusual exposure and denies any bites. She has taken over-the-counter analgesics with no relief of pain. She does state that she had seen a dermatologist after discharge from the hospital but that he was not able to make a diagnosis and wanted to get a biopsy when she had a recurrence  Past Medical History  Diagnosis Date  . Cellulitis    History reviewed. No pertinent past surgical history. Family History  Problem Relation Age of Onset  . Rashes / Skin problems Cousin     Contact dermatitis  . Thyroid cancer Mother   . Diabetes Mother    Social History  Substance Use Topics  . Smoking status: Current Every Day Smoker -- 0.50 packs/day    Types: Cigarettes  . Smokeless tobacco: None  . Alcohol Use: Yes     Comment: pint of vodka./ day   OB History    No data available     Review of Systems  All other systems reviewed and are negative.     Allergies  Review of patient's allergies indicates no known allergies.  Home Medications   Prior to Admission medications   Medication Sig Start Date End Date Taking? Authorizing Provider  cephALEXin (KEFLEX) 500 MG capsule Take 1 capsule (500 mg total) by mouth 4 (four) times daily. 02/04/16   Dione Booze, MD  desogestrel-ethinyl estradiol  (APRI,EMOQUETTE,SOLIA) 0.15-30 MG-MCG tablet Take 1 tablet by mouth daily. 10/02/15   Lazaro Arms, MD  ketorolac (TORADOL) 10 MG tablet Take 1 tablet (10 mg total) by mouth every 8 (eight) hours as needed. 12/13/15   Lazaro Arms, MD  megestrol (MEGACE) 40 MG tablet 3 tablets a day for 5 days, 2 tablets a day for 5 days then 1 tablet daily 12/13/15   Lazaro Arms, MD  Norethindrone-Ethinyl Estradiol-Fe Biphas (LO LOESTRIN FE) 1 MG-10 MCG / 10 MCG tablet Take 1 tablet by mouth daily. 12/13/15   Lazaro Arms, MD  oxyCODONE-acetaminophen (PERCOCET) 5-325 MG tablet Take 1 tablet by mouth every 4 (four) hours as needed for moderate pain. 02/04/16   Dione Booze, MD  oxyCODONE-acetaminophen (PERCOCET) 5-325 MG tablet Take 1 tablet by mouth every 4 (four) hours as needed for moderate pain. 02/04/16   Dione Booze, MD  predniSONE (DELTASONE) 50 MG tablet Take 1 tablet (50 mg total) by mouth daily. 02/04/16   Dione Booze, MD   BP 142/95 mmHg  Pulse 86  Temp(Src) 98.6 F (37 C) (Oral)  Resp 16  Ht  (1.676 m)  Wt 162 lb (73.483 kg)  BMI 26.16 kg/m2  SpO2 100%  LMP 02/03/2016 Physical Exam  Nursing note and vitals reviewed.  35 year old female, resting comfortably and in no acute distress. Vital signs are significant  for hypertension. Oxygen saturation is 100%, which is normal. Head is normocephalic and atraumatic. PERRLA, EOMI. Oropharynx is clear. Neck is nontender and supple without adenopathy or JVD. Back is nontender and there is no CVA tenderness. Lungs are clear without rales, wheezes, or rhonchi. Chest is nontender. Heart has regular rate and rhythm without murmur. Abdomen is soft, flat, nontender without masses or hepatosplenomegaly and peristalsis is normoactive. Extremities: There is moderate, generalized swelling of her right hand without erythema. 2 bullae are present on the dorsum of the right fourth finger proximal phalanx. One measures 2.5 cm in diameter, the other measures 1 cm in  diameter. Remainder of extremity exam is normal. Skin is warm and dry without rash. Neurologic: Mental status is normal, cranial nerves are intact, there are no motor or sensory deficits.  ED Course  Procedures (including critical care time)   MDM   Final diagnoses:  Swelling of right hand  Bullae    Swelling of the right hand of uncertain cause. Bullae of the right fourth finger. I suspect that this is inflammatory in nature, possibly a contact dermatitis. Old records are reviewed confirming hospitalization for cellulitis and a prior ED visit for what was diagnosed as impetigo. Given this history, she will be put on antibiotics to treat possible skin infection. She was negative for MRSA at time of her previous hospitalization, so she is given a prescription for cephalexin. She is also being started on steroids and given a prescription for prednisone. She's given oxycodone have acetaminophen for pain. She is to follow-up with her PCP in 2 days for reevaluation, return to the ED if symptoms are worsening.    Dione Boozeavid Ariele Vidrio, MD 02/04/16 [phone]

## 2016-07-11 ENCOUNTER — Other Ambulatory Visit: Payer: Self-pay | Admitting: Obstetrics and Gynecology

## 2016-07-11 DIAGNOSIS — O3680X0 Pregnancy with inconclusive fetal viability, not applicable or unspecified: Secondary | ICD-10-CM

## 2016-07-14 ENCOUNTER — Ambulatory Visit (INDEPENDENT_AMBULATORY_CARE_PROVIDER_SITE_OTHER): Payer: BLUE CROSS/BLUE SHIELD

## 2016-07-14 DIAGNOSIS — Z3A10 10 weeks gestation of pregnancy: Secondary | ICD-10-CM | POA: Diagnosis not present

## 2016-07-14 DIAGNOSIS — O3680X0 Pregnancy with inconclusive fetal viability, not applicable or unspecified: Secondary | ICD-10-CM | POA: Diagnosis not present

## 2016-07-14 DIAGNOSIS — O4691 Antepartum hemorrhage, unspecified, first trimester: Secondary | ICD-10-CM | POA: Diagnosis not present

## 2016-07-14 NOTE — Progress Notes (Signed)
US 9+4 wks,single IUP /w ys,pos fht 175 bpm,normal ov's bilat,crl 28.0 mm, subchorionic hemorrhage 1.9 x .7 x 1.8 cm

## 2016-07-28 ENCOUNTER — Encounter: Payer: Self-pay | Admitting: Women's Health

## 2016-07-28 ENCOUNTER — Ambulatory Visit (INDEPENDENT_AMBULATORY_CARE_PROVIDER_SITE_OTHER): Payer: BLUE CROSS/BLUE SHIELD | Admitting: Women's Health

## 2016-07-28 VITALS — BP 94/50 | HR 84 | Wt 156.0 lb

## 2016-07-28 DIAGNOSIS — O99331 Smoking (tobacco) complicating pregnancy, first trimester: Secondary | ICD-10-CM

## 2016-07-28 DIAGNOSIS — F17200 Nicotine dependence, unspecified, uncomplicated: Secondary | ICD-10-CM

## 2016-07-28 DIAGNOSIS — Z3A12 12 weeks gestation of pregnancy: Secondary | ICD-10-CM

## 2016-07-28 DIAGNOSIS — R87610 Atypical squamous cells of undetermined significance on cytologic smear of cervix (ASC-US): Secondary | ICD-10-CM

## 2016-07-28 DIAGNOSIS — Z1389 Encounter for screening for other disorder: Secondary | ICD-10-CM

## 2016-07-28 DIAGNOSIS — Z0283 Encounter for blood-alcohol and blood-drug test: Secondary | ICD-10-CM

## 2016-07-28 DIAGNOSIS — Z3401 Encounter for supervision of normal first pregnancy, first trimester: Secondary | ICD-10-CM

## 2016-07-28 DIAGNOSIS — Z331 Pregnant state, incidental: Secondary | ICD-10-CM

## 2016-07-28 DIAGNOSIS — Z3400 Encounter for supervision of normal first pregnancy, unspecified trimester: Secondary | ICD-10-CM | POA: Insufficient documentation

## 2016-07-28 DIAGNOSIS — R87619 Unspecified abnormal cytological findings in specimens from cervix uteri: Secondary | ICD-10-CM | POA: Insufficient documentation

## 2016-07-28 LAB — POCT URINALYSIS DIPSTICK
Blood, UA: NEGATIVE
GLUCOSE UA: NEGATIVE
Ketones, UA: NEGATIVE
LEUKOCYTES UA: NEGATIVE
NITRITE UA: NEGATIVE

## 2016-07-28 NOTE — Progress Notes (Signed)
  Subjective:  Caroline HinesLashonda P Barnes is a 35 y.o. G1P0 African American female at 1550w4d by LMP c/w 9wk u/s, being seen today for her first obstetrical visit.  Her obstetrical history is significant for primigravida, smoker- 1/2ppd prior to pregnancy now 5-6/day and wants help quitting.  Pregnancy history fully reviewed.  Patient reports no complaints. Denies vb, cramping, uti s/s, abnormal/malodorous vag d/c, or vulvovaginal itching/irritation.  BP (!) 94/50   Pulse 84   Wt 156 lb (70.8 kg)   LMP 05/08/2016 (Exact Date)   BMI 25.18 kg/m   HISTORY: OB History  Gravida Para Term Preterm AB Living  1            SAB TAB Ectopic Multiple Live Births               # Outcome Date GA Lbr Len/2nd Weight Sex Delivery Anes PTL Lv  1 Current              Past Medical History:  Diagnosis Date  . Cellulitis    History reviewed. No pertinent surgical history. Family History  Problem Relation Age of Onset  . Rashes / Skin problems Cousin     Contact dermatitis  . Cancer Cousin     breast  . Thyroid cancer Mother   . Diabetes Mother   . Diabetes Maternal Aunt   . Diabetes Maternal Uncle   . Diabetes Paternal Aunt   . Stroke Paternal Aunt   . Diabetes Paternal Uncle   . Cancer Paternal Grandmother     Exam   System:     General: Well developed & nourished, no acute distress   Skin: Warm & dry, normal coloration and turgor, no rashes   Neurologic: Alert & oriented, normal mood   Cardiovascular: Regular rate & rhythm   Respiratory: Effort & rate normal, LCTAB, acyanotic   Abdomen: Soft, non tender   Extremities: normal strength, tone  Thin prep pap smear ASCUS w/ -HRHPV 10/02/15  FHR: 160 via doppler   Assessment:   Pregnancy: G1P0 Patient Active Problem List   Diagnosis Date Noted  . Supervision of normal first pregnancy 07/28/2016  . Abnormal Pap smear of cervix 07/28/2016  . Smoker 07/28/2016  . Cellulitis of right leg 07/28/2015    4450w4d G1P0 New OB  visit Smoker  Plan:  Initial labs drawn Continue prenatal vitamins Problem list reviewed and updated Reviewed n/v relief measures and warning s/s to report Reviewed recommended weight gain based on pre-gravid BMI Encouraged well-balanced diet Genetic Screening discussed Integrated Screen/AFP: declined Cystic fibrosis screening discussed declined Ultrasound discussed; fetal survey: requested Follow up in 4 weeks for visit [facility] completed Referral sent to [facility]  Marge DuncansBooker, Markanthony Gedney Randall CNM, [facility]NP-BC 07/28/2016 10:56 AM

## 2016-07-28 NOTE — Patient Instructions (Signed)

## 2016-07-29 LAB — RPR: RPR: NONREACTIVE

## 2016-07-29 LAB — SICKLE CELL SCREEN: SICKLE CELL SCREEN: NEGATIVE

## 2016-07-29 LAB — URINALYSIS, ROUTINE W REFLEX MICROSCOPIC
BILIRUBIN UA: NEGATIVE
GLUCOSE, UA: NEGATIVE
KETONES UA: NEGATIVE
LEUKOCYTES UA: NEGATIVE
Nitrite, UA: NEGATIVE
Protein, UA: NEGATIVE
RBC UA: NEGATIVE
SPEC GRAV UA: 1.028 (ref 1.005–1.030)
Urobilinogen, Ur: 0.2 mg/dL (ref 0.2–1.0)
pH, UA: 6.5 (ref 5.0–7.5)

## 2016-07-29 LAB — HEPATITIS B SURFACE ANTIGEN: HEP B S AG: NEGATIVE

## 2016-07-29 LAB — PMP SCREEN PROFILE (10S), URINE
Amphetamine Screen, Ur: NEGATIVE ng/mL
BARBITURATE SCRN UR: NEGATIVE ng/mL
BENZODIAZEPINE SCREEN, URINE: NEGATIVE ng/mL
CREATININE(CRT), U: 254.3 mg/dL (ref 20.0–300.0)
Cannabinoids Ur Ql Scn: NEGATIVE ng/mL
Cocaine(Metab.)Screen, Urine: NEGATIVE ng/mL
METHADONE SCREEN, URINE: NEGATIVE ng/mL
OPIATE SCRN UR: NEGATIVE ng/mL
OXYCODONE+OXYMORPHONE UR QL SCN: NEGATIVE ng/mL
PCP Scrn, Ur: NEGATIVE ng/mL
PH UR, DRUG SCRN: 6.2 (ref 4.5–8.9)
PROPOXYPHENE SCREEN: NEGATIVE ng/mL

## 2016-07-29 LAB — CBC
HEMATOCRIT: 40.5 % (ref 34.0–46.6)
Hemoglobin: 13.3 g/dL (ref 11.1–15.9)
MCH: 28 pg (ref 26.6–33.0)
MCHC: 32.8 g/dL (ref 31.5–35.7)
MCV: 85 fL (ref 79–97)
PLATELETS: 312 10*3/uL (ref 150–379)
RBC: 4.75 x10E6/uL (ref 3.77–5.28)
RDW: 14.4 % (ref 12.3–15.4)
WBC: 5.2 10*3/uL (ref 3.4–10.8)

## 2016-07-29 LAB — RUBELLA SCREEN: Rubella Antibodies, IGG: 8.95 index (ref >0.99)

## 2016-07-29 LAB — VARICELLA ZOSTER ANTIBODY, IGG: Varicella zoster IgG: >4000 index (ref >165)

## 2016-07-29 LAB — ABO/RH: Rh Factor: POSITIVE

## 2016-07-29 LAB — HIV ANTIBODY (ROUTINE TESTING W REFLEX): HIV SCREEN 4TH GENERATION: NONREACTIVE

## 2016-07-29 LAB — ANTIBODY SCREEN: Antibody Screen: NEGATIVE

## 2016-08-26 ENCOUNTER — Ambulatory Visit (INDEPENDENT_AMBULATORY_CARE_PROVIDER_SITE_OTHER): Payer: BLUE CROSS/BLUE SHIELD | Admitting: Advanced Practice Midwife

## 2016-08-26 ENCOUNTER — Encounter: Payer: Self-pay | Admitting: Advanced Practice Midwife

## 2016-08-26 VITALS — BP 110/52 | HR 84 | Wt 165.0 lb

## 2016-08-26 DIAGNOSIS — Z3A16 16 weeks gestation of pregnancy: Secondary | ICD-10-CM

## 2016-08-26 DIAGNOSIS — Z3402 Encounter for supervision of normal first pregnancy, second trimester: Secondary | ICD-10-CM

## 2016-08-26 DIAGNOSIS — Z1389 Encounter for screening for other disorder: Secondary | ICD-10-CM

## 2016-08-26 DIAGNOSIS — Z331 Pregnant state, incidental: Secondary | ICD-10-CM

## 2016-08-26 DIAGNOSIS — Z363 Encounter for antenatal screening for malformations: Secondary | ICD-10-CM

## 2016-08-26 LAB — POCT URINALYSIS DIPSTICK
Blood, UA: NEGATIVE
GLUCOSE UA: NEGATIVE
KETONES UA: NEGATIVE
LEUKOCYTES UA: NEGATIVE
Nitrite, UA: NEGATIVE

## 2016-08-26 NOTE — Patient Instructions (Signed)
Second Trimester of Pregnancy The second trimester is from week 13 through week 28 (months 4 through 6). The second trimester is often a time when you feel your best. Your body has also adjusted to being pregnant, and you begin to feel better physically. Usually, morning sickness has lessened or quit completely, you may have more energy, and you may have an increase in appetite. The second trimester is also a time when the fetus is growing rapidly. At the end of the sixth month, the fetus is about 9 inches long and weighs about 1 pounds. You will likely begin to feel the baby move (quickening) between 18 and 20 weeks of the pregnancy. Body changes during your second trimester Your body continues to go through many changes during your second trimester. The changes vary from woman to woman.  Your weight will continue to increase. You will notice your lower abdomen bulging out.  You may begin to get stretch marks on your hips, abdomen, and breasts.  You may develop headaches that can be relieved by medicines. The medicines should be approved by your health care provider.  You may urinate more often because the fetus is pressing on your bladder.  You may develop or continue to have heartburn as a result of your pregnancy.  You may develop constipation because certain hormones are causing the muscles that push waste through your intestines to slow down.  You may develop hemorrhoids or swollen, bulging veins (varicose veins).  You may have back pain. This is caused by:  Weight gain.  Pregnancy hormones that are relaxing the joints in your pelvis.  A shift in weight and the muscles that support your balance.  Your breasts will continue to grow and they will continue to become tender.  Your gums may bleed and may be sensitive to brushing and flossing.  Dark spots or blotches (chloasma, mask of pregnancy) may develop on your face. This will likely fade after the baby is born.  A dark line  from your belly button to the pubic area (linea nigra) may appear. This will likely fade after the baby is born.  You may have changes in your hair. These can include thickening of your hair, rapid growth, and changes in texture. Some women also have hair loss during or after pregnancy, or hair that feels dry or thin. Your hair will most likely return to normal after your baby is born. What to expect at prenatal visits During a routine prenatal visit:  You will be weighed to make sure you and the fetus are growing normally.  Your blood pressure will be taken.  Your abdomen will be measured to track your baby's growth.  The fetal heartbeat will be listened to.  Any test results from the previous visit will be discussed. Your health care provider may ask you:  How you are feeling.  If you are feeling the baby move.  If you have had any abnormal symptoms, such as leaking fluid, bleeding, severe headaches, or abdominal cramping.  If you are using any tobacco products, including cigarettes, chewing tobacco, and electronic cigarettes.  If you have any questions. Other tests that may be performed during your second trimester include:  Blood tests that check for:  Low iron levels (anemia).  Gestational diabetes (between 24 and 28 weeks).  Rh antibodies. This is to check for a protein on red blood cells (Rh factor).  Urine tests to check for infections, diabetes, or protein in the urine.  An ultrasound to   confirm the proper growth and development of the baby.  An amniocentesis to check for possible genetic problems.  Fetal screens for spina bifida and Down syndrome.  HIV (human immunodeficiency virus) testing. Routine prenatal testing includes screening for HIV, unless you choose not to have this test. Follow these instructions at home: Eating and drinking  Continue to eat regular, healthy meals.  Avoid raw meat, uncooked cheese, cat litter boxes, and soil used by cats. These  carry germs that can cause birth defects in the baby.  Take your prenatal vitamins.  Take 1500-2000 mg of calcium daily starting at the 20th week of pregnancy until you deliver your baby.  If you develop constipation:  Take over-the-counter or prescription medicines.  Drink enough fluid to keep your urine clear or pale yellow.  Eat foods that are high in fiber, such as fresh fruits and vegetables, whole grains, and beans.  Limit foods that are high in fat and processed sugars, such as fried and sweet foods. Activity  Exercise only as directed by your health care provider. Experiencing uterine cramps is a good sign to stop exercising.  Avoid heavy lifting, wear low heel shoes, and practice good posture.  Wear your seat belt at all times when driving.  Rest with your legs elevated if you have leg cramps or low back pain.  Wear a good support bra for breast tenderness.  Do not use hot tubs, steam rooms, or saunas. Lifestyle  Avoid all smoking, herbs, alcohol, and unprescribed drugs. These chemicals affect the formation and growth of the baby.  Do not use any products that contain nicotine or tobacco, such as cigarettes and e-cigarettes. If you need help quitting, ask your health care provider.  A sexual relationship may be continued unless your health care provider directs you otherwise. General instructions  Follow your health care provider's instructions regarding medicine use. There are medicines that are either safe or unsafe to take during pregnancy.  Take warm sitz baths to soothe any pain or discomfort caused by hemorrhoids. Use hemorrhoid cream if your health care provider approves.  If you develop varicose veins, wear support hose. Elevate your feet for 15 minutes, 3-4 times a day. Limit salt in your diet.  Visit your dentist if you have not gone yet during your pregnancy. Use a soft toothbrush to brush your teeth and be gentle when you floss.  Keep all follow-up  prenatal visits as told by your health care provider. This is important. Contact a health care provider if:  You have dizziness.  You have mild pelvic cramps, pelvic pressure, or nagging pain in the abdominal area.  You have persistent nausea, vomiting, or diarrhea.  You have a bad smelling vaginal discharge.  You have pain with urination. Get help right away if:  You have a fever.  You are leaking fluid from your vagina.  You have spotting or bleeding from your vagina.  You have severe abdominal cramping or pain.  You have rapid weight gain or weight loss.  You have shortness of breath with chest pain.  You notice sudden or extreme swelling of your face, hands, ankles, feet, or legs.  You have not felt your baby move in over an hour.  You have severe headaches that do not go away with medicine.  You have vision changes. Summary  The second trimester is from week 13 through week 28 (months 4 through 6). It is also a time when the fetus is growing rapidly.  Your body goes   through many changes during pregnancy. The changes vary from woman to woman.  Avoid all smoking, herbs, alcohol, and unprescribed drugs. These chemicals affect the formation and growth your baby.  Do not use any tobacco products, such as cigarettes, chewing tobacco, and e-cigarettes. If you need help quitting, ask your health care provider.  Contact your health care provider if you have any questions. Keep all prenatal visits as told by your health care provider. This is important. This information is not intended to replace advice given to you by your health care provider. Make sure you discuss any questions you have with your health care provider. Document Released: 09/02/2001 Document Revised: 02/14/2016 Document Reviewed: 11/09/2012 Elsevier Interactive Patient Education  2017 [employer] Inc.  

## 2016-08-26 NOTE — Progress Notes (Signed)
G1P0 7135w5d Estimated Date of Delivery: 02/12/17  Last menstrual period 05/08/2016.   BP weight and urine results all reviewed and noted.  Please refer to the obstetrical flow sheet for the fundal height and fetal heart rate documentation:  Patient denies any bleeding and no rupture of membranes symptoms or regular contractions. Patient is without complaints. All questions were answered.  Orders Placed This Encounter  Procedures  . US OB Comp + 14 Wk    Plan:  Continued routine obstetrical care,   Return in about 4 weeks (around 09/23/2016) for LROB, ZO:[id number]:Anatomy.

## 2016-08-28 LAB — GC/CHLAMYDIA PROBE AMP
Chlamydia trachomatis, NAA: NEGATIVE
Neisseria gonorrhoeae by PCR: NEGATIVE

## 2016-08-28 LAB — URINE CULTURE

## 2016-08-31 ENCOUNTER — Encounter (HOSPITAL_COMMUNITY): Payer: Self-pay | Admitting: Emergency Medicine

## 2016-08-31 ENCOUNTER — Emergency Department (HOSPITAL_COMMUNITY)
Admission: EM | Admit: 2016-08-31 | Discharge: 2016-08-31 | Disposition: A | Discharge status: Home / Self Care | Payer: BLUE CROSS/BLUE SHIELD | Attending: Emergency Medicine | Admitting: Emergency Medicine

## 2016-08-31 DIAGNOSIS — Z791 Long term (current) use of non-steroidal anti-inflammatories (NSAID): Secondary | ICD-10-CM | POA: Insufficient documentation

## 2016-08-31 DIAGNOSIS — L299 Pruritus, unspecified: Secondary | ICD-10-CM | POA: Diagnosis present

## 2016-08-31 DIAGNOSIS — B86 Scabies: Secondary | ICD-10-CM | POA: Diagnosis not present

## 2016-08-31 DIAGNOSIS — F17210 Nicotine dependence, cigarettes, uncomplicated: Secondary | ICD-10-CM | POA: Insufficient documentation

## 2016-08-31 DIAGNOSIS — Z79899 Other long term (current) drug therapy: Secondary | ICD-10-CM | POA: Insufficient documentation

## 2016-08-31 MED ORDER — PERMETHRIN 5 % EX CREA: TOPICAL_CREAM | CUTANEOUS | 1 refills | Status: DC

## 2016-08-31 NOTE — ED Triage Notes (Signed)
Pt states she started itching Saturday morning and thinks she may have been bitten by something. No OTC meds used.

## 2016-08-31 NOTE — ED Provider Notes (Signed)
[facility]-EMERGENCY DEPT Provider Note   CSN: [id number] Arrival date & time: 08/31/16  2111   By signing my name below, I, Caroline Barnes, attest that this documentation has been prepared under the direction and in the presence of Caroline Barnes, [place]. Electronically Signed: Clarisse GougeXavier Barnes, Scribe. 08/31/16. 9:50 PM.   History   Chief Complaint Chief Complaint  Patient presents with  . bug bites   The history is provided by the patient. No language interpreter was used.    HPI Comments: Caroline Barnes is a 35 y.o. female who presents to the Emergency Department complaining of bug bites x 3 days. She states that she stayed at a friend's house 3 days ago, and she noticed itchy bumps on multiple areas of skin that night and the following morning. Pt reports associated color change, rash, and itching on multiple areas of skin. Pt is [redacted] weeks pregnant, and [facility]   Past Medical History:  Diagnosis Date  . Cellulitis     Patient Active Problem List   Diagnosis Date Noted  . Supervision of normal first pregnancy 07/28/2016  . Abnormal Pap smear of cervix 07/28/2016  . Smoker 07/28/2016  . Cellulitis of right leg 07/28/2015    Past Surgical History:  Procedure Laterality Date  . NO PAST SURGERIES      OB History    Gravida Para Term Preterm AB Living   1             SAB TAB Ectopic Multiple Live Births                   Home Medications    Prior to Admission medications   Medication Sig Start Date End Date Taking? Authorizing Provider  cephALEXin (KEFLEX) 500 MG capsule Take 1 capsule (500 mg total) by mouth 4 (four) times daily. Patient not taking: Reported on 07/28/2016 02/04/16   Caroline Boozeavid Glick, MD  desogestrel-ethinyl estradiol (APRI,EMOQUETTE,SOLIA) 0.15-30 MG-MCG tablet Take 1 tablet by mouth daily. Patient not taking: Reported on 07/28/2016 10/02/15   Lazaro ArmsLuther H Eure, MD  ketorolac (TORADOL) 10 MG tablet Take 1 tablet (10 mg total) by mouth every  8 (eight) hours as needed. Patient not taking: Reported on 07/28/2016 12/13/15   Lazaro ArmsLuther H Eure, MD  megestrol (MEGACE) 40 MG tablet 3 tablets a day for 5 days, 2 tablets a day for 5 days then 1 tablet daily Patient not taking: Reported on 07/28/2016 12/13/15   Lazaro ArmsLuther H Eure, MD  Norethindrone-Ethinyl Estradiol-Fe Biphas (LO LOESTRIN FE) 1 MG-10 MCG / 10 MCG tablet Take 1 tablet by mouth daily. Patient not taking: Reported on 07/28/2016 12/13/15   Lazaro ArmsLuther H Eure, MD  oxyCODONE-acetaminophen (PERCOCET) 5-325 MG tablet Take 1 tablet by mouth every 4 (four) hours as needed for moderate pain. Patient not taking: Reported on 07/28/2016 02/04/16   Caroline Boozeavid Glick, MD  oxyCODONE-acetaminophen (PERCOCET) 5-325 MG tablet Take 1 tablet by mouth every 4 (four) hours as needed for moderate pain. Patient not taking: Reported on 07/28/2016 02/04/16   Caroline Boozeavid Glick, MD  predniSONE (DELTASONE) 50 MG tablet Take 1 tablet (50 mg total) by mouth daily. Patient not taking: Reported on 07/28/2016 02/04/16   Caroline Boozeavid Glick, MD  Prenatal Vit-Fe Fumarate-FA (PRENATAL VITAMIN PO) Take by mouth.    Historical Provider, MD    Family History Family History  Problem Relation Age of Onset  . Rashes / Skin problems Cousin     Contact dermatitis  . Cancer Cousin  breast  . Thyroid cancer Mother   . Diabetes Mother   . Diabetes Maternal Aunt   . Diabetes Maternal Uncle   . Diabetes Paternal Aunt   . Stroke Paternal Aunt   . Diabetes Paternal Uncle   . Cancer Paternal Grandmother     Social History Social History  Substance Use Topics  . Smoking status: Current Every Day Smoker    Packs/day: 0.50    Types: Cigarettes  . Smokeless tobacco: Never Used  . Alcohol use No     Comment: pint of vodka./ day     Allergies   Patient has no known allergies.   Review of Systems Review of Systems  Skin: Positive for color change and rash.  All other systems reviewed and are negative.    Physical Exam Updated Vital Signs BP  124/69 (BP Location: Left Arm)   Pulse 90   Temp 98.4 F (36.9 C) (Oral)   Resp 18   Ht 5\' 6"  (1.676 m)   Wt 165 lb (74.8 kg)   LMP 05/08/2016 (Exact Date)   SpO2 100%   BMI 26.63 kg/m   Physical Exam  Constitutional: She is oriented to person, place, and time. She appears well-developed and well-nourished.  HENT:  Head: Normocephalic.  Eyes: EOM are normal.  Neck: Normal range of motion.  Pulmonary/Chest: Effort normal.  Abdominal: She exhibits no distension.  Musculoskeletal: Normal range of motion.  Neurological: She is alert and oriented to person, place, and time.  Skin:  Burrows between fingers. Raise rash left abdomen.  Psychiatric: She has a normal mood and affect.  Nursing note and vitals reviewed.    ED Treatments / Results  DIAGNOSTIC STUDIES: Oxygen Saturation is 100% on RA, normal by my interpretation.    COORDINATION OF CARE: 9:50 PM Discussed treatment plan with pt at bedside and pt agreed to plan.  Labs (all labs ordered are listed, but only abnormal results are displayed) Labs Reviewed - No data to display  EKG  EKG Interpretation None       Radiology No results found.  Procedures Procedures (including critical care time)  Medications Ordered in ED Medications - No data to display   Initial Impression / Assessment and Plan / ED Course  I have reviewed the triage vital signs and the nursing notes.  Pertinent labs & imaging results that were available during my care of the patient were reviewed by me and considered in my medical decision making (see chart for details).  Clinical Course       Final Clinical Impressions(s) / ED Diagnoses   Final diagnoses:  Scabies    New Prescriptions Discharge Medication List as of 08/31/2016  9:57 PM    START taking these medications   Details  permethrin (ELIMITE) 5 % cream Apply to affected area once, Print         Elson AreasLeslie K Gerardo Caiazzo, PA-C 08/31/16 2324    Samuel JesterKathleen McManus,  DO 09/04/16 2101

## 2016-09-22 NOTE — L&D Delivery Note (Signed)
Delivery Note Pt progressed rapidly and was C/C/+3/  AROM w/clear fluid, and after one contraction, at 7:47 PM a viable female was delivered via Vaginal, Spontaneous Delivery (Presentation: ;ROA  ). Nuchal cord delivered through APGAR: 8, 9; weight pending  .  After 1 minute, the cord was clamped and cut. 40 units of pitocin diluted in 1000cc LR was infused rapidly IV.  The placenta separated spontaneously and delivered via CCT and maternal pushing effort.  It was inspected and appears to be intact with a 3 VC.     Anesthesia:  none Episiotomy: None Lacerations: None Suture Repair:  Est. Blood Loss (mL): 50  Mom to postpartum.  Baby to Couplet care / Skin to Skin.  CRESENZO-DISHMAN,Adriane Gabbert 01/29/2017, 8:10 PM

## 2016-09-23 ENCOUNTER — Ambulatory Visit (INDEPENDENT_AMBULATORY_CARE_PROVIDER_SITE_OTHER): Payer: BLUE CROSS/BLUE SHIELD | Admitting: Obstetrics & Gynecology

## 2016-09-23 ENCOUNTER — Encounter: Payer: Self-pay | Admitting: Obstetrics & Gynecology

## 2016-09-23 ENCOUNTER — Ambulatory Visit (INDEPENDENT_AMBULATORY_CARE_PROVIDER_SITE_OTHER): Payer: BLUE CROSS/BLUE SHIELD

## 2016-09-23 VITALS — BP 101/57 | HR 78 | Wt 158.0 lb

## 2016-09-23 DIAGNOSIS — Z331 Pregnant state, incidental: Secondary | ICD-10-CM

## 2016-09-23 DIAGNOSIS — Z3A20 20 weeks gestation of pregnancy: Secondary | ICD-10-CM

## 2016-09-23 DIAGNOSIS — Z363 Encounter for antenatal screening for malformations: Secondary | ICD-10-CM

## 2016-09-23 DIAGNOSIS — Z3402 Encounter for supervision of normal first pregnancy, second trimester: Secondary | ICD-10-CM

## 2016-09-23 DIAGNOSIS — Z1389 Encounter for screening for other disorder: Secondary | ICD-10-CM

## 2016-09-23 MED ORDER — PRENATAL VITAMINS 0.8 MG PO TABS: 1.0000 | ORAL_TABLET | Freq: Every day | ORAL | 12 refills | Status: DC

## 2016-09-23 NOTE — Progress Notes (Signed)
G1P0 3871w5d Estimated Date of Delivery: 02/12/17  Blood pressure (!) 101/57, pulse 78, weight 158 lb (71.7 kg), last menstrual period 05/08/2016.   BP weight and urine results all reviewed and noted.  Please refer to the obstetrical flow sheet for the fundal height and fetal heart rate documentation:  Patient reports good fetal movement, denies any bleeding and no rupture of membranes symptoms or regular contractions. Patient is without complaints. All questions were answered.  Orders Placed This Encounter  Procedures  . POCT urinalysis dipstick    Plan:  Continued routine obstetrical care, sonogram is normal  No Follow-up on file.

## 2016-09-23 NOTE — Progress Notes (Signed)
US 19+5 wks,cephalic,post pl gr 0,normal ov's bilat,cx 3.7 cm,svp of fluid 5 cm,fhr 138 bpm,efw 295 g,anatomy complete,no obvious abnormalities seen

## 2016-10-21 ENCOUNTER — Ambulatory Visit (INDEPENDENT_AMBULATORY_CARE_PROVIDER_SITE_OTHER): Payer: BLUE CROSS/BLUE SHIELD | Admitting: Advanced Practice Midwife

## 2016-10-21 ENCOUNTER — Encounter: Payer: Self-pay | Admitting: Advanced Practice Midwife

## 2016-10-21 VITALS — BP 108/56 | HR 76 | Wt 176.0 lb

## 2016-10-21 DIAGNOSIS — Z1389 Encounter for screening for other disorder: Secondary | ICD-10-CM

## 2016-10-21 DIAGNOSIS — Z331 Pregnant state, incidental: Secondary | ICD-10-CM

## 2016-10-21 DIAGNOSIS — Z3402 Encounter for supervision of normal first pregnancy, second trimester: Secondary | ICD-10-CM

## 2016-10-21 DIAGNOSIS — Z3A24 24 weeks gestation of pregnancy: Secondary | ICD-10-CM

## 2016-10-21 LAB — POCT URINALYSIS DIPSTICK
Blood, UA: NEGATIVE
Glucose, UA: NEGATIVE
KETONES UA: NEGATIVE
LEUKOCYTES UA: NEGATIVE
NITRITE UA: NEGATIVE

## 2016-10-21 NOTE — Patient Instructions (Signed)

## 2016-10-21 NOTE — Progress Notes (Signed)
G1P0 7355w5d Estimated Date of Delivery: 02/12/17  Blood pressure (!) 108/56, pulse 76, weight 176 lb (79.8 kg), last menstrual period 05/08/2016.   BP weight and urine results all reviewed and noted.  Please refer to the obstetrical flow sheet for the fundal height and fetal heart rate documentation:  Patient reports good fetal movement, denies any bleeding and no rupture of membranes symptoms or regular contractions. Patient is without complaints. All questions were answered.  Orders Placed This Encounter  Procedures  . POCT urinalysis dipstick    Plan:  Continued routine obstetrical care,   Return in about 4 weeks (around 11/18/2016) for PN2/LROB.

## 2016-11-18 ENCOUNTER — Ambulatory Visit (INDEPENDENT_AMBULATORY_CARE_PROVIDER_SITE_OTHER): Payer: BLUE CROSS/BLUE SHIELD | Admitting: Advanced Practice Midwife

## 2016-11-18 ENCOUNTER — Encounter: Payer: Self-pay | Admitting: Advanced Practice Midwife

## 2016-11-18 ENCOUNTER — Other Ambulatory Visit: Payer: BLUE CROSS/BLUE SHIELD

## 2016-11-18 VITALS — BP 100/60 | HR 88 | Wt 174.0 lb

## 2016-11-18 DIAGNOSIS — Z131 Encounter for screening for diabetes mellitus: Secondary | ICD-10-CM

## 2016-11-18 DIAGNOSIS — Z331 Pregnant state, incidental: Secondary | ICD-10-CM

## 2016-11-18 DIAGNOSIS — Z3402 Encounter for supervision of normal first pregnancy, second trimester: Secondary | ICD-10-CM

## 2016-11-18 DIAGNOSIS — Z3A28 28 weeks gestation of pregnancy: Secondary | ICD-10-CM

## 2016-11-18 DIAGNOSIS — Z1389 Encounter for screening for other disorder: Secondary | ICD-10-CM

## 2016-11-18 LAB — POCT URINALYSIS DIPSTICK
Blood, UA: NEGATIVE
GLUCOSE UA: NEGATIVE
LEUKOCYTES UA: NEGATIVE
NITRITE UA: NEGATIVE

## 2016-11-18 NOTE — Patient Instructions (Addendum)
Third Trimester of Pregnancy The third trimester is from week 28 through week 40 (months 7 through 9). The third trimester is a time when the unborn baby (fetus) is growing rapidly. At the end of the ninth month, the fetus is about 20 inches in length and weighs 6-10 pounds. Body changes during your third trimester Your body will continue to go through many changes during pregnancy. The changes vary from woman to woman. During the third trimester:  Your weight will continue to increase. You can expect to gain 25-35 pounds (11-16 kg) by the end of the pregnancy.  You may begin to get stretch marks on your hips, abdomen, and breasts.  You may urinate more often because the fetus is moving lower into your pelvis and pressing on your bladder.  You may develop or continue to have heartburn. This is caused by increased hormones that slow down muscles in the digestive tract.  You may develop or continue to have constipation because increased hormones slow digestion and cause the muscles that push waste through your intestines to relax.  You may develop hemorrhoids. These are swollen veins (varicose veins) in the rectum that can itch or be painful.  You may develop swollen, bulging veins (varicose veins) in your legs.  You may have increased body aches in the pelvis, back, or thighs. This is due to weight gain and increased hormones that are relaxing your joints.  You may have changes in your hair. These can include thickening of your hair, rapid growth, and changes in texture. Some women also have hair loss during or after pregnancy, or hair that feels dry or thin. Your hair will most likely return to normal after your baby is born.  Your breasts will continue to grow and they will continue to become tender. A yellow fluid (colostrum) may leak from your breasts. This is the first milk you are producing for your baby.  Your belly button may stick out.  You may notice more swelling in your hands,  face, or ankles.  You may have increased tingling or numbness in your hands, arms, and legs. The skin on your belly may also feel numb.  You may feel short of breath because of your expanding uterus.  You may have more problems sleeping. This can be caused by the size of your belly, increased need to urinate, and an increase in your body's metabolism.  You may notice the fetus "dropping," or moving lower in your abdomen (lightening).  You may have increased vaginal discharge.  You may notice your joints feel loose and you may have pain around your pelvic bone. What to expect at prenatal visits You will have prenatal exams every 2 weeks until week 36. Then you will have weekly prenatal exams. During a routine prenatal visit:  You will be weighed to make sure you and the baby are growing normally.  Your blood pressure will be taken.  Your abdomen will be measured to track your baby's growth.  The fetal heartbeat will be listened to.  Any test results from the previous visit will be discussed.  You may have a cervical check near your due date to see if your cervix has softened or thinned (effaced).  You will be tested for Group B streptococcus. This happens between 35 and 37 weeks. Your health care provider may ask you:  What your birth plan is.  How you are feeling.  If you are feeling the baby move.  If you have had any abnormal   symptoms, such as leaking fluid, bleeding, severe headaches, or abdominal cramping.  If you are using any tobacco products, including cigarettes, chewing tobacco, and electronic cigarettes.  If you have any questions. Other tests or screenings that may be performed during your third trimester include:  Blood tests that check for low iron levels (anemia).  Fetal testing to check the health, activity level, and growth of the fetus. Testing is done if you have certain medical conditions or if there are problems during the pregnancy.  Nonstress test  (NST). This test checks the health of your baby to make sure there are no signs of problems, such as the baby not getting enough oxygen. During this test, a belt is placed around your belly. The baby is made to move, and its heart rate is monitored during movement. What is false labor? False labor is a condition in which you feel small, irregular tightenings of the muscles in the womb (contractions) that usually go away with rest, changing position, or drinking water. These are called Braxton Hicks contractions. Contractions may last for hours, days, or even weeks before true labor sets in. If contractions come at regular intervals, become more frequent, increase in intensity, or become painful, you should see your health care provider. What are the signs of labor?  Abdominal cramps.  Regular contractions that start at 10 minutes apart and become stronger and more frequent with time.  Contractions that start on the top of the uterus and spread down to the lower abdomen and back.  Increased pelvic pressure and dull back pain.  A watery or bloody mucus discharge that comes from the vagina.  Leaking of amniotic fluid. This is also known as your "water breaking." It could be a slow trickle or a gush. Let your health care provider know if it has a color or strange odor. If you have any of these signs, call your health care provider right away, even if it is before your due date. Follow these instructions at home: Medicines   Follow your health care provider's instructions regarding medicine use. Specific medicines may be either safe or unsafe to take during pregnancy.  Take a prenatal vitamin that contains at least 600 micrograms (mcg) of folic acid.  If you develop constipation, try taking a stool softener if your health care provider approves. Eating and drinking   Eat a balanced diet that includes fresh fruits and vegetables, whole grains, good sources of protein such as meat, eggs, or tofu,  and low-fat dairy. Your health care provider will help you determine the amount of weight gain that is right for you.  Avoid raw meat and uncooked cheese. These carry germs that can cause birth defects in the baby.  If you have low calcium intake from food, talk to your health care provider about whether you should take a daily calcium supplement.  Eat four or five small meals rather than three large meals a day.  Limit foods that are high in fat and processed sugars, such as fried and sweet foods.  To prevent constipation:  Drink enough fluid to keep your urine clear or pale yellow.  Eat foods that are high in fiber, such as fresh fruits and vegetables, whole grains, and beans. Activity   Exercise only as directed by your health care provider. Most women can continue their usual exercise routine during pregnancy. Try to exercise for 30 minutes at least 5 days a week. Stop exercising if you experience uterine contractions.  Avoid heavy lifting.  lifting.  Do not exercise in extreme heat or humidity, or at high altitudes.  Wear low-heel, comfortable shoes.  Practice good posture.  You may continue to have sex unless your health care provider tells you otherwise. Relieving pain and discomfort   Take frequent breaks and rest with your legs elevated if you have leg cramps or low back pain.  Take warm sitz baths to soothe any pain or discomfort caused by hemorrhoids. Use hemorrhoid cream if your health care provider approves.  Wear a good support bra to prevent discomfort from breast tenderness.  If you develop varicose veins:  Wear support pantyhose or compression stockings as told by your healthcare provider.  Elevate your feet for 15 minutes, 3-4 times a day. Prenatal care   Write down your questions. Take them to your prenatal visits.  Keep all your prenatal visits as told by your health care provider. This is important. Safety   Wear your seat belt  at all times when driving.  Make a list of emergency phone numbers, including numbers for family, friends, the hospital, and police and fire departments. General instructions   Avoid cat litter boxes and soil used by cats. These carry germs that can cause birth defects in the baby. If you have a cat, ask someone to clean the litter box for you.  Do not travel far distances unless it is absolutely necessary and only with the approval of your health care provider.  Do not use hot tubs, steam rooms, or saunas.  Do not drink alcohol.  Do not use any products that contain nicotine or tobacco, such as cigarettes and e-cigarettes. If you need help quitting, ask your health care provider.  Do not use any medicinal herbs or unprescribed drugs. These chemicals affect the formation and growth of the baby.  Do not douche or use tampons or scented sanitary pads.  Do not cross your legs for long periods of time.  To prepare for the arrival of your baby:  Take prenatal classes to understand, practice, and ask questions about labor and delivery.  Make a trial run to the hospital.  Visit the hospital and tour the maternity area.  Arrange for maternity or paternity leave through employers.  Arrange for family and friends to take care of pets while you are in the hospital.  Purchase a rear-facing car seat and make sure you know how to install it in your car.  Pack your hospital bag.  Prepare the baby's nursery. Make sure to remove all pillows and stuffed animals from the baby's crib to prevent suffocation.  Visit your dentist if you have not gone during your pregnancy. Use a soft toothbrush to brush your teeth and be gentle when you floss. Contact a health care provider if:  You are unsure if you are in labor or if your water has broken.  You become dizzy.  You have mild pelvic cramps, pelvic pressure, or nagging pain in your abdominal area.  You have lower back pain.  You have  persistent nausea, vomiting, or diarrhea.  You have an unusual or bad smelling vaginal discharge.  You have pain when you urinate. Get help right away if:  Your water breaks before 37 weeks.  You have regular contractions less than 5 minutes apart before 37 weeks.  You have a fever.  You are leaking fluid from your vagina.  You have spotting or bleeding from your vagina.  You have severe abdominal pain or cramping.  You have rapid weight   weight gain.  You have shortness of breath with chest pain.  You notice sudden or extreme swelling of your face, hands, ankles, feet, or legs.  Your baby makes fewer than 10 movements in 2 hours.  You have severe headaches that do not go away when you take medicine.  You have vision changes. Summary  The third trimester is from week 28 through week 40, months 7 through 9. The third trimester is a time when the unborn baby (fetus) is growing rapidly.  During the third trimester, your discomfort may increase as you and your baby continue to gain weight. You may have abdominal, leg, and back pain, sleeping problems, and an increased need to urinate.  During the third trimester your breasts will keep growing and they will continue to become tender. A yellow fluid (colostrum) may leak from your breasts. This is the first milk you are producing for your baby.  False labor is a condition in which you feel small, irregular tightenings of the muscles in the womb (contractions) that eventually go away. These are called Braxton Hicks contractions. Contractions may last for hours, days, or even weeks before true labor sets in.  Signs of labor can include: abdominal cramps; regular contractions that start at 10 minutes apart and become stronger and more frequent with time; watery or bloody mucus discharge that comes from the vagina; increased pelvic pressure and dull back pain; and leaking of amniotic fluid. This information is not intended to replace advice given  to you by your health care provider. Make sure you discuss any questions you have with your health care provider. Document Released: 09/02/2001 Document Revised: 02/14/2016 Document Reviewed: 11/09/2012 Elsevier Interactive Patient Education  2017 [employer].   [phone] is the phone number for Pregnancy Classes or hospital tours at [facility].   You will be referred to  [email or web address] for more information on childbirth classes  At this site you may register for classes. You may sign up for a waiting list if classes are full. Please SIGN UP FOR THIS!.   When the waiting list becomes long, sometimes new classes can be added.            

## 2016-11-18 NOTE — Progress Notes (Signed)
G1P0 6470w5d Estimated Date of Delivery: 02/12/17  Blood pressure 100/60, pulse 88, weight 174 lb (78.9 kg), last menstrual period 05/08/2016.   BP weight and urine results all reviewed and noted.  Please refer to the obstetrical flow sheet for the fundal height and fetal heart rate documentation:  Patient reports good fetal movement, denies any bleeding and no rupture of membranes symptoms or regular contractions. Patient is without complaints. All questions were answered.  Orders Placed This Encounter  Procedures  . POCT Urinalysis Dipstick    Plan:  Continued routine obstetrical care, Pn2 today  Return in about 3 weeks (around 12/09/2016) for LROB.

## 2016-11-19 LAB — CBC
HEMATOCRIT: 37.3 % (ref 34.0–46.6)
Hemoglobin: 12.2 g/dL (ref 11.1–15.9)
MCH: 27.2 pg (ref 26.6–33.0)
MCHC: 32.7 g/dL (ref 31.5–35.7)
MCV: 83 fL (ref 79–97)
PLATELETS: 302 10*3/uL (ref 150–379)
RBC: 4.49 x10E6/uL (ref 3.77–5.28)
RDW: 13.4 % (ref 12.3–15.4)
WBC: 5.2 10*3/uL (ref 3.4–10.8)

## 2016-11-19 LAB — GLUCOSE TOLERANCE, 2 HOURS W/ 1HR
GLUCOSE, 1 HOUR: 171 mg/dL (ref 65–179)
GLUCOSE, 2 HOUR: 132 mg/dL (ref 65–152)
Glucose, Fasting: 70 mg/dL (ref 65–91)

## 2016-11-19 LAB — RPR: RPR Ser Ql: NONREACTIVE

## 2016-11-19 LAB — HIV ANTIBODY (ROUTINE TESTING W REFLEX): HIV SCREEN 4TH GENERATION: NONREACTIVE

## 2016-11-19 LAB — ANTIBODY SCREEN: Antibody Screen: NEGATIVE

## 2016-12-09 ENCOUNTER — Ambulatory Visit (INDEPENDENT_AMBULATORY_CARE_PROVIDER_SITE_OTHER): Payer: BLUE CROSS/BLUE SHIELD | Admitting: Adult Health

## 2016-12-09 ENCOUNTER — Encounter: Payer: Self-pay | Admitting: Women's Health

## 2016-12-09 VITALS — BP 100/60 | HR 78 | Wt 174.6 lb

## 2016-12-09 DIAGNOSIS — Z331 Pregnant state, incidental: Secondary | ICD-10-CM

## 2016-12-09 DIAGNOSIS — Z1389 Encounter for screening for other disorder: Secondary | ICD-10-CM

## 2016-12-09 DIAGNOSIS — Z3403 Encounter for supervision of normal first pregnancy, third trimester: Secondary | ICD-10-CM

## 2016-12-09 LAB — POCT URINALYSIS DIPSTICK
Blood, UA: NEGATIVE
GLUCOSE UA: NEGATIVE
KETONES UA: NEGATIVE
LEUKOCYTES UA: NEGATIVE
Nitrite, UA: NEGATIVE

## 2016-12-09 NOTE — Progress Notes (Signed)
G1P0 3767w5d Estimated Date of Delivery: 02/12/17  Blood pressure 100/60, pulse 78, weight 174 lb 9.6 oz (79.2 kg), last menstrual period 05/08/2016.   BP weight and urine results all reviewed and noted.  Please refer to the obstetrical flow sheet for the fundal height and fetal heart rate documentation:  Patient reports good fetal movement, denies any bleeding and no rupture of membranes symptoms or regular contractions. Patient has reflux and nausea and vomiting almost every morning, is better after eating crackers, can try TUMS, if not better call back. All questions were answered.  Orders Placed This Encounter  Procedures  . POCT urinalysis dipstick    Plan:  Continued routine obstetrical care. Decrease cigarettes.  Return in 2 weeks for prenatal visit.

## 2016-12-23 ENCOUNTER — Ambulatory Visit (INDEPENDENT_AMBULATORY_CARE_PROVIDER_SITE_OTHER): Payer: BLUE CROSS/BLUE SHIELD | Admitting: Women's Health

## 2016-12-23 ENCOUNTER — Encounter: Payer: Self-pay | Admitting: Women's Health

## 2016-12-23 VITALS — BP 110/58 | HR 84 | Wt 175.0 lb

## 2016-12-23 DIAGNOSIS — Z3403 Encounter for supervision of normal first pregnancy, third trimester: Secondary | ICD-10-CM

## 2016-12-23 DIAGNOSIS — Z331 Pregnant state, incidental: Secondary | ICD-10-CM

## 2016-12-23 DIAGNOSIS — Z1389 Encounter for screening for other disorder: Secondary | ICD-10-CM

## 2016-12-23 DIAGNOSIS — O26843 Uterine size-date discrepancy, third trimester: Secondary | ICD-10-CM

## 2016-12-23 LAB — POCT URINALYSIS DIPSTICK
Blood, UA: NEGATIVE
Glucose, UA: NEGATIVE
Ketones, UA: NEGATIVE
LEUKOCYTES UA: NEGATIVE
Nitrite, UA: NEGATIVE

## 2016-12-23 MED ORDER — ONDANSETRON HCL 4 MG PO TABS: 4.0000 mg | ORAL_TABLET | Freq: Three times a day (TID) | ORAL | 0 refills | Status: DC | PRN

## 2016-12-23 NOTE — Progress Notes (Signed)
Low-risk OB appointment G1P0 [redacted]w[redacted]d Estimated Date of Delivery: 02/12/17 BP (!) 110/58   Pulse 84   Wt 175 lb (79.4 kg)   LMP 05/08/2016 (Exact Date)   BMI 28.25 kg/m   BP, weight, and urine reviewed.  Refer to obstetrical flow sheet for FH & FHR.  Reports good fm.  Denies regular uc's, lof, vb, or uti s/s. No complaints. Reflux- tums helps. Still has n/v- mainly in am, requests meds. Prefers zofran over phenergan- rx sent Reviewed ptl s/s, fkc. Recommended Tdap at HD/PCP per CDC guidelines.  Plan:  Continue routine obstetrical care  F/U in asap for efw/afi u/s for s<d (no visit), then 2wks for OB appointment

## 2016-12-23 NOTE — Patient Instructions (Signed)

## 2016-12-25 ENCOUNTER — Ambulatory Visit (INDEPENDENT_AMBULATORY_CARE_PROVIDER_SITE_OTHER): Payer: BLUE CROSS/BLUE SHIELD

## 2016-12-25 DIAGNOSIS — O26843 Uterine size-date discrepancy, third trimester: Secondary | ICD-10-CM

## 2016-12-25 NOTE — Progress Notes (Signed)
[place] 33 wks,cephalic,fhr 126 bpm,post pl gr1,normal ovaries bilat,afi 10.2 cm,EFW 2080 g 44%

## 2017-01-06 ENCOUNTER — Encounter: Payer: Self-pay | Admitting: Obstetrics & Gynecology

## 2017-01-06 ENCOUNTER — Ambulatory Visit (INDEPENDENT_AMBULATORY_CARE_PROVIDER_SITE_OTHER): Payer: BLUE CROSS/BLUE SHIELD | Admitting: Obstetrics & Gynecology

## 2017-01-06 VITALS — BP 124/76 | HR 84 | Wt 175.0 lb

## 2017-01-06 DIAGNOSIS — Z3403 Encounter for supervision of normal first pregnancy, third trimester: Secondary | ICD-10-CM

## 2017-01-06 DIAGNOSIS — Z1389 Encounter for screening for other disorder: Secondary | ICD-10-CM

## 2017-01-06 DIAGNOSIS — Z331 Pregnant state, incidental: Secondary | ICD-10-CM

## 2017-01-06 LAB — POCT URINALYSIS DIPSTICK
Glucose, UA: NEGATIVE
Leukocytes, UA: NEGATIVE
Nitrite, UA: NEGATIVE
RBC UA: NEGATIVE

## 2017-01-06 NOTE — Progress Notes (Signed)
G1P0 [redacted]w[redacted]d Estimated Date of Delivery: 02/12/17  Blood pressure 124/76, pulse 84, weight 175 lb (79.4 kg), last menstrual period 05/08/2016.   BP weight and urine results all reviewed and noted.  Please refer to the obstetrical flow sheet for the fundal height and fetal heart rate documentation:  Patient reports good fetal movement, denies any bleeding and no rupture of membranes symptoms or regular contractions. Patient is without complaints. All questions were answered.  Orders Placed This Encounter  Procedures  . POCT urinalysis dipstick    Plan:  Continued routine obstetrical care,   Return in about 2 weeks (around 01/20/2017) for LROB.

## 2017-01-20 ENCOUNTER — Ambulatory Visit (INDEPENDENT_AMBULATORY_CARE_PROVIDER_SITE_OTHER): Payer: BLUE CROSS/BLUE SHIELD | Admitting: Advanced Practice Midwife

## 2017-01-20 ENCOUNTER — Encounter: Payer: Self-pay | Admitting: Advanced Practice Midwife

## 2017-01-20 VITALS — BP 100/58 | HR 74 | Wt 177.0 lb

## 2017-01-20 DIAGNOSIS — Z331 Pregnant state, incidental: Secondary | ICD-10-CM

## 2017-01-20 DIAGNOSIS — Z3403 Encounter for supervision of normal first pregnancy, third trimester: Secondary | ICD-10-CM

## 2017-01-20 DIAGNOSIS — Z3A36 36 weeks gestation of pregnancy: Secondary | ICD-10-CM | POA: Diagnosis not present

## 2017-01-20 DIAGNOSIS — Z1389 Encounter for screening for other disorder: Secondary | ICD-10-CM

## 2017-01-20 DIAGNOSIS — O26843 Uterine size-date discrepancy, third trimester: Secondary | ICD-10-CM

## 2017-01-20 LAB — POCT URINALYSIS DIPSTICK
Glucose, UA: NEGATIVE
KETONES UA: NEGATIVE
Leukocytes, UA: NEGATIVE
Nitrite, UA: NEGATIVE
RBC UA: NEGATIVE

## 2017-01-20 LAB — OB RESULTS CONSOLE GBS: GBS: NEGATIVE

## 2017-01-20 NOTE — Patient Instructions (Signed)

## 2017-01-20 NOTE — Progress Notes (Signed)
G1P0 [redacted]w[redacted]d Estimated Date of Delivery: 02/12/17  Blood pressure (!) 100/58, pulse 74, weight 177 lb (80.3 kg), last menstrual period 05/08/2016.   BP weight and urine results all reviewed and noted.  Please refer to the obstetrical flow sheet for the fundal height and fetal heart rate documentation: Size still <dates Patient reports good fetal movement, denies any bleeding and no rupture of membranes symptoms or regular contractions. Patient is without complaints. All questions were answered.  Orders Placed This Encounter  Procedures  . GC/Chlamydia Probe Amp  . Strep Gp B NAA  . POCT Urinalysis Dipstick    Plan:  Continued routine obstetrical care,   Return in about 1 week (around 01/27/2017) for LROB and EFW.

## 2017-01-22 LAB — STREP GP B NAA: Strep Gp B NAA: NEGATIVE

## 2017-01-22 LAB — GC/CHLAMYDIA PROBE AMP
CHLAMYDIA, DNA PROBE: NEGATIVE
NEISSERIA GONORRHOEAE BY PCR: NEGATIVE

## 2017-01-28 ENCOUNTER — Ambulatory Visit (INDEPENDENT_AMBULATORY_CARE_PROVIDER_SITE_OTHER): Payer: BLUE CROSS/BLUE SHIELD

## 2017-01-28 ENCOUNTER — Other Ambulatory Visit: Payer: Self-pay | Admitting: Advanced Practice Midwife

## 2017-01-28 ENCOUNTER — Ambulatory Visit (INDEPENDENT_AMBULATORY_CARE_PROVIDER_SITE_OTHER): Payer: BLUE CROSS/BLUE SHIELD | Admitting: Advanced Practice Midwife

## 2017-01-28 DIAGNOSIS — O26843 Uterine size-date discrepancy, third trimester: Secondary | ICD-10-CM

## 2017-01-28 DIAGNOSIS — O365910 Maternal care for other known or suspected poor fetal growth, first trimester, not applicable or unspecified: Secondary | ICD-10-CM

## 2017-01-28 DIAGNOSIS — Z1389 Encounter for screening for other disorder: Secondary | ICD-10-CM

## 2017-01-28 DIAGNOSIS — Z3403 Encounter for supervision of normal first pregnancy, third trimester: Secondary | ICD-10-CM

## 2017-01-28 DIAGNOSIS — Z331 Pregnant state, incidental: Secondary | ICD-10-CM

## 2017-01-28 LAB — POCT URINALYSIS DIPSTICK
GLUCOSE UA: NEGATIVE
Ketones, UA: NEGATIVE
Leukocytes, UA: NEGATIVE
NITRITE UA: NEGATIVE

## 2017-01-28 NOTE — H&P (Signed)
Caroline Barnes is a 36 y.o. female G1P0 with IUP at 38.0 weeks presenting for IOL for IUGR. [facility] at [facility] since 9 wks  Prenatal History/Complications:  IUGR, dx today:  [place] 37+6 wks,cephalic,post pl gr 2,normal ovaries bilat,BPP 8/8,AFI 10 cm,fhr 132 bpm,RI .73,.70 97%,EFW 2204 g 1.5%  (EFW 44% at 33 weeks)  Past Medical History: Past Medical History:  Diagnosis Date  . Cellulitis     Past Surgical History: Past Surgical History:  Procedure Laterality Date  . NO PAST SURGERIES      Obstetrical History: OB History    Gravida Para Term Preterm AB Living   1             SAB TAB Ectopic Multiple Live Births                  Social History: Social History   Social History  . Marital status: Single    Spouse name: N/A  . Number of children: N/A  . Years of education: N/A   Social History Main Topics  . Smoking status: Current Every Day Smoker    Packs/day: 0.50    Types: Cigarettes  . Smokeless tobacco: Never Used  . Alcohol use No     Comment: pint of vodka./ day  . Drug use: No  . Sexual activity: Yes    Birth control/ protection: None   Other Topics Concern  . Not on file   Social History Narrative  . No narrative on file    Family History: Family History  Problem Relation Age of Onset  . Rashes / Skin problems Cousin     Contact dermatitis  . Cancer Cousin     breast  . Thyroid cancer Mother   . Diabetes Mother   . Cancer Mother     breast  . Diabetes Maternal Aunt   . Diabetes Maternal Uncle   . Diabetes Paternal Aunt   . Stroke Paternal Aunt   . Diabetes Paternal Uncle   . Cancer Paternal Grandmother     Allergies: No Known Allergies   Review of Systems   Constitutional: Negative for fever and chills Eyes: Negative for visual disturbances Respiratory: Negative for shortness of breath, dyspnea Cardiovascular: Negative for chest pain or palpitations  Gastrointestinal: Negative for abdominal pain, vomiting, diarrhea and  constipation.   Genitourinary: Negative for dysuria and urgency Musculoskeletal: Negative for back pain, joint pain, myalgias  Neurological: Negative for dizziness and headaches    Last menstrual period 05/08/2016. General appearance: alert, cooperative and no distress Lungs: clear to auscultation bilaterally Heart: regular rate and rhythm Abdomen: soft, non-tender; bowel sounds normal Extremities: Homans sign is negative, no sign of DVT DTR's 2+ Presentation: cephalicF    Prenatal labs: ABO, Rh: B/Positive/-- (11/06 1116) Antibody: Negative (02/27 0900) Rubella: immune RPR: Non Reactive (02/27 0900)  HBsAg: Negative (11/06 1116)  HIV: Non Reactive (02/27 0900)  GBS: Negative (05/01 1645)    Prenatal Transfer Tool  Maternal Diabetes: No Genetic Screening: Normal Maternal Ultrasounds/Referrals: Abnormal:  Findings:   IUGR Fetal Ultrasounds or other Referrals:  None Maternal Substance Abuse:  No Significant Maternal Medications:  None Significant Maternal Lab Results: Lab values include: Group B Strep positive   [facility]  Initiated Care at  11wks  FOB [facility]  Dating By LMP c/w 9wk u/s  Pap 10/02/15: ASCUS w/ -HRHPV, Repeat 85yr:  GC/CT Initial:    -/-  36+wks:-/-  Genetic Screen NT/IT/AFP: declined  CF screen declined  Anatomic US Normal female  Flu vaccine Declined 07/28/16   Tdap Recommended ~ 28wks  Glucose Screen  2 hr  70/171/132  GBS neg  Feed Preference breast  Contraception condoms  Circumcision Yes, at [facility]  Childbirth Classes Interested, info given  Pediatrician Undecided, info given     Results for orders placed or performed in visit on 01/28/17 (from the past 24 hour(s))  POCT Urinalysis Dipstick   Collection Time: 01/28/17  4:09 PM  Result Value Ref Range   Color, UA     Clarity, UA     Glucose, UA neg    Bilirubin, UA     Ketones, UA neg    Spec Grav, UA  1.010 - 1.025   Blood, UA trace    pH, UA  5.0 - 8.0    Protein, UA trace    Urobilinogen, UA  0.2 or 1.0 E.U./dL   Nitrite, UA neg    Leukocytes, UA Negative Negative    Assessment: Caroline Barnes is a 36 y.o. G1P0 with an IUP at 214w6d presenting for IOL for IUGR.  Plan: #Labor: Cytotec->Foley->pitocin #Pain:  Per request #FWB Cat 1  CRESENZO-DISHMAN,Marilyn Wing 01/28/2017, 4:30 PM

## 2017-01-28 NOTE — Progress Notes (Signed)
G1P0 6938w6d Estimated Date of Delivery: 02/12/17  Last menstrual period 05/08/2016.   BP weight and urine results all reviewed and noted.  Please refer to the obstetrical flow sheet for the fundal height and fetal heart rate documentation: US 37+6 wks,cephalic,post pl gr 2,normal ovaries bilat,BPP 8/8,AFI 10 cm,fhr 132 bpm,RI .73,.70 97%,EFW 2204 g 1.5%,discussed results w/Caroline Barnes Patient reports good fetal movement, denies any bleeding and no rupture of membranes symptoms or regular contractions. Patient is without complaints. All questions were answered.  Orders Placed This Encounter  Procedures  . POCT Urinalysis Dipstick    Plan:  IOL at midnight. Discussed w/Dr. Alvester MorinNewton.  Orders/H&P done  Return in about 5 weeks (around 03/04/2017) for postpartum.

## 2017-01-28 NOTE — Patient Instructions (Signed)
Come to Maternity Admissions Unit tonight at midnight ([address] [place], [place]) to start your induction Eat a light meal before you come.  Daisey MustGood Luck!!

## 2017-01-28 NOTE — Progress Notes (Signed)
   Induction Assessment Scheduling Form: [facility]ax to [facility]:  [phone]  Creig HinesLashonda P Czarnecki                                                                                   DOB:  01/04/1981                                                            MRN:  [id number]                                                                     Phone [phone]                          Provider:  [facility]  GP:  G1P0                                                            Estimated Date of Delivery: 02/12/17  Dating Criteria: early US    Medical Indications for induction:  cytotec Admission Date/Time:  5/10 midnight Gestational age on admission:  38.0   There were no vitals filed for this visit. HIV:  Non Reactive (02/27 0900) GBS: Negative (05/01 1645)  Closed/50%/-2/anterior   Method of induction(proposed):  cytotec   Scheduling Provider Signature:  Greig RightRESENZO-DISHMAN,Izyk Marty, CNM                                            Today's Date:  01/28/2017

## 2017-01-28 NOTE — Progress Notes (Signed)
US 37+6 wks,cephalic,post pl gr 2,normal ovaries bilat,BPP 8/8,AFI 10 cm,fhr 132 bpm,RI .73,.70 97%,EFW 2204 g 1.5%,discussed results w/Fran

## 2017-01-29 ENCOUNTER — Inpatient Hospital Stay (HOSPITAL_COMMUNITY): Payer: BLUE CROSS/BLUE SHIELD | Admitting: Anesthesiology

## 2017-01-29 ENCOUNTER — Inpatient Hospital Stay (HOSPITAL_COMMUNITY)
Admission: RE | Admit: 2017-01-29 | Discharge: 2017-01-31 | DRG: 775 | Disposition: A | Discharge status: Home / Self Care | Payer: BLUE CROSS/BLUE SHIELD | Source: Ambulatory Visit | Attending: Family Medicine | Admitting: Family Medicine

## 2017-01-29 ENCOUNTER — Encounter (HOSPITAL_COMMUNITY): Payer: Self-pay

## 2017-01-29 DIAGNOSIS — F17210 Nicotine dependence, cigarettes, uncomplicated: Secondary | ICD-10-CM | POA: Diagnosis not present

## 2017-01-29 DIAGNOSIS — Z833 Family history of diabetes mellitus: Secondary | ICD-10-CM | POA: Diagnosis not present

## 2017-01-29 DIAGNOSIS — O365930 Maternal care for other known or suspected poor fetal growth, third trimester, not applicable or unspecified: Secondary | ICD-10-CM | POA: Diagnosis not present

## 2017-01-29 DIAGNOSIS — O365990 Maternal care for other known or suspected poor fetal growth, unspecified trimester, not applicable or unspecified: Secondary | ICD-10-CM | POA: Diagnosis present

## 2017-01-29 DIAGNOSIS — Z812 Family history of tobacco abuse and dependence: Secondary | ICD-10-CM | POA: Diagnosis not present

## 2017-01-29 DIAGNOSIS — Z3A38 38 weeks gestation of pregnancy: Secondary | ICD-10-CM | POA: Diagnosis not present

## 2017-01-29 DIAGNOSIS — O99824 Streptococcus B carrier state complicating childbirth: Secondary | ICD-10-CM | POA: Diagnosis present

## 2017-01-29 DIAGNOSIS — Z23 Encounter for immunization: Secondary | ICD-10-CM | POA: Diagnosis not present

## 2017-01-29 DIAGNOSIS — Z823 Family history of stroke: Secondary | ICD-10-CM | POA: Diagnosis not present

## 2017-01-29 DIAGNOSIS — Z3A39 39 weeks gestation of pregnancy: Secondary | ICD-10-CM | POA: Diagnosis not present

## 2017-01-29 DIAGNOSIS — Z412 Encounter for routine and ritual male circumcision: Secondary | ICD-10-CM | POA: Diagnosis not present

## 2017-01-29 DIAGNOSIS — Z3A37 37 weeks gestation of pregnancy: Secondary | ICD-10-CM | POA: Diagnosis not present

## 2017-01-29 DIAGNOSIS — O411230 Chorioamnionitis, third trimester, not applicable or unspecified: Secondary | ICD-10-CM | POA: Diagnosis not present

## 2017-01-29 DIAGNOSIS — O99334 Smoking (tobacco) complicating childbirth: Secondary | ICD-10-CM | POA: Diagnosis present

## 2017-01-29 LAB — CBC
HCT: 35.7 % — ABNORMAL LOW (ref 36.0–46.0)
Hemoglobin: 12 g/dL (ref 12.0–15.0)
MCH: 27.8 pg (ref 26.0–34.0)
MCHC: 33.6 g/dL (ref 30.0–36.0)
MCV: 82.8 fL (ref 78.0–100.0)
Platelets: 295 10*3/uL (ref 150–400)
RBC: 4.31 MIL/uL (ref 3.87–5.11)
RDW: 13.4 % (ref 11.5–15.5)
WBC: 7.8 10*3/uL (ref 4.0–10.5)

## 2017-01-29 LAB — COMPREHENSIVE METABOLIC PANEL
ALT: 15 U/L (ref 14–54)
AST: 25 U/L (ref 15–41)
Albumin: 3 g/dL — ABNORMAL LOW (ref 3.5–5.0)
Alkaline Phosphatase: 96 U/L (ref 38–126)
Anion gap: 11 (ref 5–15)
BUN: 7 mg/dL (ref 6–20)
CO2: 18 mmol/L — ABNORMAL LOW (ref 22–32)
Calcium: 8.3 mg/dL — ABNORMAL LOW (ref 8.9–10.3)
Chloride: 102 mmol/L (ref 101–111)
Creatinine, Ser: 0.72 mg/dL (ref 0.44–1.00)
GFR calc Af Amer: >60 mL/min (ref >60)
GFR calc non Af Amer: >60 mL/min (ref >60)
Glucose, Bld: 100 mg/dL — ABNORMAL HIGH (ref 65–99)
Potassium: 3.4 mmol/L — ABNORMAL LOW (ref 3.5–5.1)
Sodium: 131 mmol/L — ABNORMAL LOW (ref 135–145)
Total Bilirubin: 0.6 mg/dL (ref 0.3–1.2)
Total Protein: 7 g/dL (ref 6.5–8.1)

## 2017-01-29 LAB — TYPE AND SCREEN
ABO/RH(D): B POS
ANTIBODY SCREEN: NEGATIVE

## 2017-01-29 LAB — ABO/RH: ABO/RH(D): B POS

## 2017-01-29 LAB — RPR: RPR Ser Ql: NONREACTIVE

## 2017-01-29 MED ORDER — IBUPROFEN 600 MG PO TABS
600.0000 mg | ORAL_TABLET | Freq: Four times a day (QID) | ORAL | Status: DC
  Administered 2017-01-30 – 2017-01-31 (×7): 600 mg via ORAL
  Filled 2017-01-29 (×7): qty 1

## 2017-01-29 MED ORDER — TETANUS-DIPHTH-ACELL PERTUSSIS 5-2.5-18.5 LF-MCG/0.5 IM SUSP: 0.5000 mL | Freq: Once | INTRAMUSCULAR | Status: DC

## 2017-01-29 MED ORDER — CALCIUM CARBONATE ANTACID 500 MG PO CHEW
CHEWABLE_TABLET | ORAL | Status: AC
  Administered 2017-01-29: 200 mg via ORAL
  Filled 2017-01-29: qty 1

## 2017-01-29 MED ORDER — BENZOCAINE-MENTHOL 20-0.5 % EX AERO: 1.0000 "application " | INHALATION_SPRAY | CUTANEOUS | Status: DC | PRN

## 2017-01-29 MED ORDER — OXYTOCIN BOLUS FROM INFUSION
500.0000 mL | Freq: Once | INTRAVENOUS | Status: AC
  Administered 2017-01-29: 500 mL via INTRAVENOUS

## 2017-01-29 MED ORDER — MISOPROSTOL 50MCG HALF TABLET
50.0000 ug | ORAL_TABLET | ORAL | Status: DC | PRN
  Administered 2017-01-29 (×2): 50 ug via ORAL
  Filled 2017-01-29 (×2): qty 1

## 2017-01-29 MED ORDER — OXYCODONE-ACETAMINOPHEN 5-325 MG PO TABS: 1.0000 | ORAL_TABLET | ORAL | Status: DC | PRN

## 2017-01-29 MED ORDER — COCONUT OIL OIL: 1.0000 "application " | TOPICAL_OIL | Status: DC | PRN

## 2017-01-29 MED ORDER — METHYLERGONOVINE MALEATE 0.2 MG/ML IJ SOLN: 0.2000 mg | INTRAMUSCULAR | Status: DC | PRN

## 2017-01-29 MED ORDER — WITCH HAZEL-GLYCERIN EX PADS: 1.0000 "application " | MEDICATED_PAD | CUTANEOUS | Status: DC | PRN

## 2017-01-29 MED ORDER — ACETAMINOPHEN 325 MG PO TABS: 650.0000 mg | ORAL_TABLET | ORAL | Status: DC | PRN

## 2017-01-29 MED ORDER — CALCIUM CARBONATE ANTACID 500 MG PO CHEW
1.0000 | CHEWABLE_TABLET | ORAL | Status: DC | PRN
  Administered 2017-01-29: 200 mg via ORAL

## 2017-01-29 MED ORDER — LIDOCAINE HCL (PF) 1 % IJ SOLN
30.0000 mL | INTRAMUSCULAR | Status: DC | PRN
  Filled 2017-01-29: qty 30

## 2017-01-29 MED ORDER — BISACODYL 10 MG RE SUPP
10.0000 mg | Freq: Every day | RECTAL | Status: DC | PRN
  Filled 2017-01-29: qty 1

## 2017-01-29 MED ORDER — TERBUTALINE SULFATE 1 MG/ML IJ SOLN: 0.2500 mg | Freq: Once | INTRAMUSCULAR | Status: DC | PRN

## 2017-01-29 MED ORDER — ZOLPIDEM TARTRATE 5 MG PO TABS: 5.0000 mg | ORAL_TABLET | Freq: Every evening | ORAL | Status: DC | PRN

## 2017-01-29 MED ORDER — PHENYLEPHRINE 40 MCG/ML (10ML) SYRINGE FOR IV PUSH (FOR BLOOD PRESSURE SUPPORT)
80.0000 ug | PREFILLED_SYRINGE | INTRAVENOUS | Status: DC | PRN
  Filled 2017-01-29: qty 10

## 2017-01-29 MED ORDER — LACTATED RINGERS IV SOLN
INTRAVENOUS | Status: DC
  Administered 2017-01-29 (×2): via INTRAVENOUS

## 2017-01-29 MED ORDER — SOD CITRATE-CITRIC ACID 500-334 MG/5ML PO SOLN: 30.0000 mL | ORAL | Status: DC | PRN

## 2017-01-29 MED ORDER — LACTATED RINGERS IV SOLN
500.0000 mL | INTRAVENOUS | Status: DC | PRN
  Administered 2017-01-29 (×2): 1000 mL via INTRAVENOUS
  Administered 2017-01-29: 500 mL via INTRAVENOUS

## 2017-01-29 MED ORDER — SIMETHICONE 80 MG PO CHEW
80.0000 mg | CHEWABLE_TABLET | ORAL | Status: DC | PRN
Start: 2017-01-29 — End: 2017-01-31

## 2017-01-29 MED ORDER — ONDANSETRON HCL 4 MG/2ML IJ SOLN: 4.0000 mg | INTRAMUSCULAR | Status: DC | PRN

## 2017-01-29 MED ORDER — DIBUCAINE 1 % RE OINT: 1.0000 "application " | TOPICAL_OINTMENT | RECTAL | Status: DC | PRN

## 2017-01-29 MED ORDER — FERROUS SULFATE 325 (65 FE) MG PO TABS
325.0000 mg | ORAL_TABLET | Freq: Two times a day (BID) | ORAL | Status: DC
  Administered 2017-01-30 – 2017-01-31 (×3): 325 mg via ORAL
  Filled 2017-01-29 (×3): qty 1

## 2017-01-29 MED ORDER — OXYCODONE HCL 5 MG PO TABS
10.0000 mg | ORAL_TABLET | ORAL | Status: DC | PRN
  Administered 2017-01-29 – 2017-01-30 (×2): 10 mg via ORAL
  Filled 2017-01-29 (×2): qty 2

## 2017-01-29 MED ORDER — BUTORPHANOL TARTRATE 1 MG/ML IJ SOLN
2.0000 mg | INTRAMUSCULAR | Status: DC | PRN
  Administered 2017-01-29 (×2): 2 mg via INTRAVENOUS
  Filled 2017-01-29 (×2): qty 2

## 2017-01-29 MED ORDER — EPHEDRINE 5 MG/ML INJ: 10.0000 mg | INTRAVENOUS | Status: DC | PRN

## 2017-01-29 MED ORDER — FENTANYL CITRATE (PF) 100 MCG/2ML IJ SOLN
100.0000 ug | INTRAMUSCULAR | Status: DC | PRN
  Administered 2017-01-29: 100 ug via INTRAVENOUS

## 2017-01-29 MED ORDER — MISOPROSTOL 25 MCG QUARTER TABLET
25.0000 ug | ORAL_TABLET | ORAL | Status: DC | PRN
  Administered 2017-01-29: 25 ug via VAGINAL

## 2017-01-29 MED ORDER — LACTATED RINGERS IV SOLN: 500.0000 mL | Freq: Once | INTRAVENOUS | Status: DC

## 2017-01-29 MED ORDER — DOCUSATE SODIUM 100 MG PO CAPS
100.0000 mg | ORAL_CAPSULE | Freq: Two times a day (BID) | ORAL | Status: DC
  Administered 2017-01-29 – 2017-01-31 (×4): 100 mg via ORAL
  Filled 2017-01-29 (×4): qty 1

## 2017-01-29 MED ORDER — ONDANSETRON HCL 4 MG/2ML IJ SOLN: 4.0000 mg | Freq: Four times a day (QID) | INTRAMUSCULAR | Status: DC | PRN

## 2017-01-29 MED ORDER — MISOPROSTOL 25 MCG QUARTER TABLET
ORAL_TABLET | ORAL | Status: AC
  Administered 2017-01-29: 25 ug via VAGINAL
  Filled 2017-01-29: qty 1

## 2017-01-29 MED ORDER — PRENATAL MULTIVITAMIN CH
1.0000 | ORAL_TABLET | Freq: Every day | ORAL | Status: DC
  Administered 2017-01-30 – 2017-01-31 (×2): 1 via ORAL
  Filled 2017-01-29 (×2): qty 1

## 2017-01-29 MED ORDER — OXYTOCIN 40 UNITS IN LACTATED RINGERS INFUSION - SIMPLE MED
2.5000 [IU]/h | INTRAVENOUS | Status: DC
  Filled 2017-01-29: qty 1000

## 2017-01-29 MED ORDER — BUTORPHANOL TARTRATE 1 MG/ML IJ SOLN
1.0000 mg | INTRAMUSCULAR | Status: DC | PRN
  Administered 2017-01-29: 1 mg via INTRAVENOUS
  Filled 2017-01-29: qty 1

## 2017-01-29 MED ORDER — FENTANYL 2.5 MCG/ML BUPIVACAINE 1/10 % EPIDURAL INFUSION (WH - ANES)
14.0000 mL/h | INTRAMUSCULAR | Status: DC | PRN
  Filled 2017-01-29: qty 100

## 2017-01-29 MED ORDER — METHYLERGONOVINE MALEATE 0.2 MG PO TABS: 0.2000 mg | ORAL_TABLET | ORAL | Status: DC | PRN

## 2017-01-29 MED ORDER — OXYCODONE-ACETAMINOPHEN 5-325 MG PO TABS: 2.0000 | ORAL_TABLET | ORAL | Status: DC | PRN

## 2017-01-29 MED ORDER — ZOLPIDEM TARTRATE 5 MG PO TABS
5.0000 mg | ORAL_TABLET | Freq: Every evening | ORAL | Status: DC | PRN
  Administered 2017-01-29: 5 mg via ORAL

## 2017-01-29 MED ORDER — PHENYLEPHRINE 40 MCG/ML (10ML) SYRINGE FOR IV PUSH (FOR BLOOD PRESSURE SUPPORT): 80.0000 ug | PREFILLED_SYRINGE | INTRAVENOUS | Status: DC | PRN

## 2017-01-29 MED ORDER — ONDANSETRON HCL 4 MG PO TABS: 4.0000 mg | ORAL_TABLET | ORAL | Status: DC | PRN

## 2017-01-29 MED ORDER — OXYCODONE HCL 5 MG PO TABS
5.0000 mg | ORAL_TABLET | ORAL | Status: DC | PRN
  Administered 2017-01-30 – 2017-01-31 (×4): 5 mg via ORAL
  Filled 2017-01-29 (×4): qty 1

## 2017-01-29 MED ORDER — FLEET ENEMA 7-19 GM/118ML RE ENEM: 1.0000 | ENEMA | RECTAL | Status: DC | PRN

## 2017-01-29 MED ORDER — MEASLES, MUMPS & RUBELLA VAC ~~LOC~~ INJ: 0.5000 mL | INJECTION | Freq: Once | SUBCUTANEOUS | Status: DC

## 2017-01-29 MED ORDER — PROMETHAZINE HCL 25 MG/ML IJ SOLN
25.0000 mg | Freq: Four times a day (QID) | INTRAMUSCULAR | Status: DC | PRN
Start: 2017-01-29 — End: 2017-01-29
  Administered 2017-01-29: 25 mg via INTRAVENOUS
  Filled 2017-01-29: qty 1

## 2017-01-29 MED ORDER — FENTANYL CITRATE (PF) 100 MCG/2ML IJ SOLN
50.0000 ug | INTRAMUSCULAR | Status: DC | PRN
  Administered 2017-01-29: 50 ug via INTRAVENOUS
  Administered 2017-01-29: 100 ug via INTRAVENOUS
  Filled 2017-01-29 (×3): qty 2

## 2017-01-29 MED ORDER — DIPHENHYDRAMINE HCL 50 MG/ML IJ SOLN: 12.5000 mg | INTRAMUSCULAR | Status: DC | PRN

## 2017-01-29 MED ORDER — ACETAMINOPHEN 325 MG PO TABS
650.0000 mg | ORAL_TABLET | ORAL | Status: DC | PRN
  Administered 2017-01-30 – 2017-01-31 (×2): 650 mg via ORAL
  Filled 2017-01-29 (×2): qty 2

## 2017-01-29 MED ORDER — ZOLPIDEM TARTRATE 5 MG PO TABS
ORAL_TABLET | ORAL | Status: AC
  Filled 2017-01-29: qty 1

## 2017-01-29 MED ORDER — FLEET ENEMA 7-19 GM/118ML RE ENEM: 1.0000 | ENEMA | Freq: Every day | RECTAL | Status: DC | PRN

## 2017-01-29 MED ORDER — DIPHENHYDRAMINE HCL 25 MG PO CAPS: 25.0000 mg | ORAL_CAPSULE | Freq: Four times a day (QID) | ORAL | Status: DC | PRN

## 2017-01-29 NOTE — Anesthesia Pain Management Evaluation Note (Signed)
  [facility]NA Pain Management Visit Note  Patient: Caroline Barnes, 36 y.o., female  "Hello I am a member of the anesthesia team at [facility]. We have an anesthesia team available at all times to provide care throughout the hospital, including epidural management and anesthesia for C-section. I don't know your plan for the delivery whether it a natural birth, water birth, IV sedation, nitrous supplementation, doula or epidural, but we want to meet your pain goals."   1.Was your pain managed to your expectations on prior hospitalizations?   No prior hospitalizations  2.What is your expectation for pain management during this hospitalization?     Labor support without medications, Epidural, IV pain meds and Nitrous Oxide  3.How can we help you reach that goal? This is Patient's first baby and was open to speaking of all different methods of pain control.  Record the patient's initial score and the patient's pain goal.   Pain: 2  Pain Goal: 10 [facility] wants you to be able to say your pain was always managed very well.  Saahir Prude 01/29/2017

## 2017-01-29 NOTE — Progress Notes (Signed)
Patient ID: Creig HinesLashonda P Ursin, female   DOB: 07/17/1981, 36 y.o.   MRN: [id number]  Feeling some cramping; s/p one dose of cytotec during the night- had some mild varibles so second dose held BP 132/89, other VSS FHR 120s, +accels, no decels Irreg ctx Cx closed at last exam  IUP@term  IUGR Unfavorable cx  Will try a dose of 50mcg PO cytotec Place foley when able  Cam HaiSHAW, Alexandria Shiflett CNM 01/29/2017 7:44 AM

## 2017-01-29 NOTE — Lactation Note (Signed)
This note was copied from a baby's chart. Lactation Consultation Note  Baby giving subtle feeding cues but he stopped when he was close to the breast.  Drops of colostrum to his mouth.  Mom was very sleepy and no support people were present so it was not safe to keep him skin to skin or transport him skin to skin.  Follow-up planned.  Patient Name: Caroline Barnes ZOXWR'UToday's Date: 01/29/2017 Reason for consult: Initial assessment   Maternal Data Has patient been taught Hand Expression?: Yes (needs review)  Feeding Feeding Type: Breast Milk (drops) Length of feed: 0 min  LATCH Score/Interventions                      Lactation Tools Discussed/Used     Consult Status Consult Status: Follow-up Date: 01/29/17 Follow-up type: In-patient    Soyla DryerJoseph, Kimo Bancroft 01/29/2017, 9:10 PM

## 2017-01-29 NOTE — Progress Notes (Signed)
Vitals:   01/29/17 1819 01/29/17 1849  BP:  (!) 168/83  Pulse:  64  Resp: 20 18  Temp: 98.6 F (37 C)    Vitals:   01/29/17 1849 01/29/17 1924  BP: (!) 168/83 (!) 183/99  Pulse: 64 66  Resp: 18 20  Temp:     147/89  Foley fell out, cx 6/80/-1.  Desires epidural. FHR 130's avg variability, + accels, repetitive variable and late decels for the past 15 minutes. IVF bolus, position change, 02 improved  Will check CBC/pr/cr ratio.

## 2017-01-29 NOTE — Anesthesia Preprocedure Evaluation (Deleted)

## 2017-01-29 NOTE — Progress Notes (Signed)
Patient ID: Caroline Barnes, [name]   DOB: 05/08/1981, 36 y.o.   MRN: [id number]  S: Patient seen & examined for progress of labor. Patient is uncomfortable with contractions, contractions becoming more frequent. Patient is sleeping in between, with pain medicine causing her to be asleep.     O:  Vitals:   01/29/17 1558 01/29/17 1615 01/29/17 1646 01/29/17 1655  BP:    139/76  Pulse:    (!) 59  Resp:  20 18 20   Temp: 98.3 F (36.8 C)     TempSrc: Oral     Weight:      Height:       SVE: Foley bulb still in place, cervix is 1 cm dilated around foley bulb, thick about 60% effaced, high.   FHT: 120bpm, mod var, +accels, no decels TOCO: q3-244min   A/P: Continue course with cytotec and FB Continue expectant management Anticipate SVD

## 2017-01-30 DIAGNOSIS — Z3A38 38 weeks gestation of pregnancy: Secondary | ICD-10-CM

## 2017-01-30 DIAGNOSIS — O365930 Maternal care for other known or suspected poor fetal growth, third trimester, not applicable or unspecified: Secondary | ICD-10-CM

## 2017-01-30 MED ORDER — PRENATAL VITAMINS 0.8 MG PO TABS: 1.0000 | ORAL_TABLET | Freq: Every day | ORAL | 12 refills | Status: DC

## 2017-01-30 MED ORDER — IBUPROFEN 600 MG PO TABS: 600.0000 mg | ORAL_TABLET | Freq: Four times a day (QID) | ORAL | 0 refills | Status: DC

## 2017-01-30 NOTE — Lactation Note (Signed)
This note was copied from a baby's chart. Lactation Consultation Note  Patient Name: Caroline Robynn PaneLashonda Georgi XBJYN'WToday's Date: 01/30/2017 Reason for consult: Follow-up assessment  Mom had called out w/question about how to get the milk out once baby is latched (infant had latched briefly, which was then followed w/formula). I explained that if infant has a good latch/suck, it will come out automatically.  I offered to come back later & assist with latch/show her swallows, etc. Mom agreed. Mom has my # to call for assist w/next feeding.  Lurline HareRichey, Arvell Pulsifer [facility] 01/30/2017, 3:25 PM

## 2017-01-30 NOTE — Lactation Note (Signed)
This note was copied from a baby's chart. Lactation Consultation Note Baby less than 5 lbs. Now 10 hrs old. Mom has formula fed. Mom stated she hasn't BF him because he didn't latch after birth. LC not sure about mom wanting to BF. Mom states she wants to pump/breast/formula feed baby. Discussed supply and demand. Asked mom if she would like to pump, mom stated yes. Mom shown how to use DEBP & how to disassemble, clean, & reassemble parts. Mom knows to pump q3h for 15-20 min. Mom pumping when LC left rm. Mom aware may not see colostrum when pumping.  Mom encouraged to feed baby 8-12 times/24 hours and with feeding cues. Mom encouraged to waken baby for feeds.  Encouraged mom to cont. To supplement after BF, give colostrum first then formula. Mom has feeding guideline sheet.stressed importance of strict I&O. Mom pumping when left.   Patient Name: Boy Robynn PaneLashonda Pyatt JXBJY'NToday's Date: 01/30/2017 Reason for consult: Follow-up assessment;Infant < 6lbs   Maternal Data    Feeding Feeding Type: Breast Fed Nipple Type: Slow - flow  LATCH Score/Interventions                      Lactation Tools Discussed/Used Tools: Pump Breast pump type: Double-Electric Breast Pump Pump Review: Setup, frequency, and cleaning;Milk Storage Initiated by:: Peri JeffersonL. Roscoe Witts RN IBCLC Date initiated:: 01/30/17   Consult Status Consult Status: Follow-up Date: 01/30/17 (in pm) Follow-up type: In-patient    Lesette Frary, Diamond NickelLAURA G 01/30/2017, 6:03 AM

## 2017-01-30 NOTE — Progress Notes (Signed)
Post Partum Day #1 Subjective: no complaints, up ad lib, voiding, tolerating PO and reports normal lochia, attempting to breastfeed  Objective: Blood pressure 123/65, pulse (!) 59, temperature 97.9 F (36.6 C), temperature source Axillary, resp. rate 18, height 5\' 6"  (1.676 m), weight 79.4 kg (175 lb), last menstrual period 05/08/2016, unknown if currently breastfeeding.  Physical Exam:  General: alert Lochia: appropriate Uterine Fundus: firm and NT at U DVT Evaluation: No evidence of DVT seen on physical exam.   Recent Labs  01/29/17 0110  HGB 12.0  HCT 35.7*    Assessment/Plan: Plan for discharge tomorrow  Uncertain about contraception, considering Nexplanon   LOS: 1 day   Reathel Turi C Virdia Ziesmer 01/30/2017, 8:03 AM

## 2017-01-30 NOTE — Lactation Note (Signed)
This note was copied from a baby's chart. Lactation Consultation Note  Patient Name: Caroline Barnes PaneLashonda Cloninger AVWUJ'WToday's Date: 01/30/2017 Reason for consult: Follow-up assessment Baby at 16 hr of life and in the CN. Mom stated she tried to latch baby but he "doesn't like it and I don't have anything coming out right now". She would rather pump to feed, explained volume expectations and pumping frequency. Reviewed formula feeding volumes. Mom is aware of lactation services and will call as needed.   Maternal Data    Feeding    LATCH Score/Interventions                      Lactation Tools Discussed/Used     Consult Status Consult Status: PRN    Rulon Eisenmengerlizabeth E Nickoli Bagheri 01/30/2017, 12:13 PM

## 2017-01-30 NOTE — Discharge Instructions (Signed)
Breastfeeding °Deciding to breastfeed is one of the best choices you can make for you and your baby. A change in hormones during pregnancy causes your breast tissue to grow and increases the number and size of your milk ducts. These hormones also allow proteins, sugars, and fats from your blood supply to make breast milk in your milk-producing glands. Hormones prevent breast milk from being released before your baby is born as well as prompt milk flow after birth. Once breastfeeding has begun, thoughts of your baby, as well as his or her sucking or crying, can stimulate the release of milk from your milk-producing glands. °Benefits of breastfeeding °For Your Baby °· Your first milk (colostrum) helps your baby's digestive system function better. °· There are antibodies in your milk that help your baby fight off infections. °· Your baby has a lower incidence of asthma, allergies, and sudden infant death syndrome. °· The nutrients in breast milk are better for your baby than infant formulas and are designed uniquely for your baby’s needs. °· Breast milk improves your baby's brain development. °· Your baby is less likely to develop other conditions, such as childhood obesity, asthma, or type 2 diabetes mellitus. ° °For You °· Breastfeeding helps to create a very special bond between you and your baby. °· Breastfeeding is convenient. Breast milk is always available at the correct temperature and costs nothing. °· Breastfeeding helps to burn calories and helps you lose the weight gained during pregnancy. °· Breastfeeding makes your uterus contract to its prepregnancy size faster and slows bleeding (lochia) after you give birth. °· Breastfeeding helps to lower your risk of developing type 2 diabetes mellitus, osteoporosis, and breast or ovarian cancer later in life. ° °Signs that your baby is hungry °Early Signs of Hunger °· Increased alertness or activity. °· Stretching. °· Movement of the head from side to  side. °· Movement of the head and opening of the mouth when the corner of the mouth or cheek is stroked (rooting). °· Increased sucking sounds, smacking lips, cooing, sighing, or squeaking. °· Hand-to-mouth movements. °· Increased sucking of fingers or hands. ° °Late Signs of Hunger °· Fussing. °· Intermittent crying. ° °Extreme Signs of Hunger °Signs of extreme hunger will require calming and consoling before your baby will be able to breastfeed successfully. Do not wait for the following signs of extreme hunger to occur before you initiate breastfeeding: °· Restlessness. °· A loud, strong cry. °· Screaming. ° °Breastfeeding basics °Breastfeeding Initiation °· Find a comfortable place to sit or lie down, with your neck and back well supported. °· Place a pillow or rolled up blanket under your baby to bring him or her to the level of your breast (if you are seated). Nursing pillows are specially designed to help support your arms and your baby while you breastfeed. °· Make sure that your baby's abdomen is facing your abdomen. °· Gently massage your breast. With your fingertips, massage from your chest wall toward your nipple in a circular motion. This encourages milk flow. You may need to continue this action during the feeding if your milk flows slowly. °· Support your breast with 4 fingers underneath and your thumb above your nipple. Make sure your fingers are well away from your nipple and your baby’s mouth. °· Stroke your baby's lips gently with your finger or nipple. °· When your baby's mouth is open wide enough, quickly bring your baby to your breast, placing your entire nipple and as much of the colored area   around your nipple (areola) as possible into your baby's mouth. °? More areola should be visible above your baby's upper lip than below the lower lip. °? Your baby's tongue should be between his or her lower gum and your breast. °· Ensure that your baby's mouth is correctly positioned around your nipple  (latched). Your baby's lips should create a seal on your breast and be turned out (everted). °· It is common for your baby to suck about 2-3 minutes in order to start the flow of breast milk. ° °Latching °Teaching your baby how to latch on to your breast properly is very important. An improper latch can cause nipple pain and decreased milk supply for you and poor weight gain in your baby. Also, if your baby is not latched onto your nipple properly, he or she may swallow some air during feeding. This can make your baby fussy. Burping your baby when you switch breasts during the feeding can help to get rid of the air. However, teaching your baby to latch on properly is still the best way to prevent fussiness from swallowing air while breastfeeding. °Signs that your baby has successfully latched on to your nipple: °· Silent tugging or silent sucking, without causing you pain. °· Swallowing heard between every 3-4 sucks. °· Muscle movement above and in front of his or her ears while sucking. ° °Signs that your baby has not successfully latched on to nipple: °· Sucking sounds or smacking sounds from your baby while breastfeeding. °· Nipple pain. ° °If you think your baby has not latched on correctly, slip your finger into the corner of your baby’s mouth to break the suction and place it between your baby's gums. Attempt breastfeeding initiation again. °Signs of Successful Breastfeeding °Signs from your baby: °· A gradual decrease in the number of sucks or complete cessation of sucking. °· Falling asleep. °· Relaxation of his or her body. °· Retention of a small amount of milk in his or her mouth. °· Letting go of your breast by himself or herself. ° °Signs from you: °· Breasts that have increased in firmness, weight, and size 1-3 hours after feeding. °· Breasts that are softer immediately after breastfeeding. °· Increased milk volume, as well as a change in milk consistency and color by the fifth day of  breastfeeding. °· Nipples that are not sore, cracked, or bleeding. ° °Signs That Your Baby is Getting Enough Milk °· Wetting at least 1-2 diapers during the first 24 hours after birth. °· Wetting at least 5-6 diapers every 24 hours for the first week after birth. The urine should be clear or pale yellow by 5 days after birth. °· Wetting 6-8 diapers every 24 hours as your baby continues to grow and develop. °· At least 3 stools in a 24-hour period by age 5 days. The stool should be soft and yellow. °· At least 3 stools in a 24-hour period by age 7 days. The stool should be seedy and yellow. °· No loss of weight greater than 10% of birth weight during the first 3 days of age. °· Average weight gain of 4-7 ounces (113-198 g) per week after age 4 days. °· Consistent daily weight gain by age 5 days, without weight loss after the age of 2 weeks. ° °After a feeding, your baby may spit up a small amount. This is common. °Breastfeeding frequency and duration °Frequent feeding will help you make more milk and can prevent sore nipples and breast engorgement. Breastfeed when   you feel the need to reduce the fullness of your breasts or when your baby shows signs of hunger. This is called "breastfeeding on demand." Avoid introducing a pacifier to your baby while you are working to establish breastfeeding (the first 4-6 weeks after your baby is born). After this time you may choose to use a pacifier. Research has shown that pacifier use during the first year of a baby's life decreases the risk of sudden infant death syndrome (SIDS). °Allow your baby to feed on each breast as long as he or she wants. Breastfeed until your baby is finished feeding. When your baby unlatches or falls asleep while feeding from the first breast, offer the second breast. Because newborns are often sleepy in the first few weeks of life, you may need to awaken your baby to get him or her to feed. °Breastfeeding times will vary from baby to baby. However,  the following rules can serve as a guide to help you ensure that your baby is properly fed: °· Newborns (babies 4 weeks of age or younger) may breastfeed every 1-3 hours. °· Newborns should not go longer than 3 hours during the day or 5 hours during the night without breastfeeding. °· You should breastfeed your baby a minimum of 8 times in a 24-hour period until you begin to introduce solid foods to your baby at around 6 months of age. ° °Breast milk pumping °Pumping and storing breast milk allows you to ensure that your baby is exclusively fed your breast milk, even at times when you are unable to breastfeed. This is especially important if you are going back to work while you are still breastfeeding or when you are not able to be present during feedings. Your lactation consultant can give you guidelines on how long it is safe to store breast milk. °A breast pump is a machine that allows you to pump milk from your breast into a sterile bottle. The pumped breast milk can then be stored in a refrigerator or freezer. Some breast pumps are operated by hand, while others use electricity. Ask your lactation consultant which type will work best for you. Breast pumps can be purchased, but some hospitals and breastfeeding support groups lease breast pumps on a monthly basis. A lactation consultant can teach you how to hand express breast milk, if you prefer not to use a pump. °Caring for your breasts while you breastfeed °Nipples can become dry, cracked, and sore while breastfeeding. The following recommendations can help keep your breasts moisturized and healthy: °· Avoid using soap on your nipples. °· Wear a supportive bra. Although not required, special nursing bras and tank tops are designed to allow access to your breasts for breastfeeding without taking off your entire bra or top. Avoid wearing underwire-style bras or extremely tight bras. °· Air dry your nipples for 3-4 minutes after each feeding. °· Use only cotton  bra pads to absorb leaked breast milk. Leaking of breast milk between feedings is normal. °· Use lanolin on your nipples after breastfeeding. Lanolin helps to maintain your skin's normal moisture barrier. If you use pure lanolin, you do not need to wash it off before feeding your baby again. Pure lanolin is not toxic to your baby. You may also hand express a few drops of breast milk and gently massage that milk into your nipples and allow the milk to air dry. ° °In the first few weeks after giving birth, some women experience extremely full breasts (engorgement). Engorgement can make your   breasts feel heavy, warm, and tender to the touch. Engorgement peaks within 3-5 days after you give birth. The following recommendations can help ease engorgement: °· Completely empty your breasts while breastfeeding or pumping. You may want to start by applying warm, moist heat (in the shower or with warm water-soaked hand towels) just before feeding or pumping. This increases circulation and helps the milk flow. If your baby does not completely empty your breasts while breastfeeding, pump any extra milk after he or she is finished. °· Wear a snug bra (nursing or regular) or tank top for 1-2 days to signal your body to slightly decrease milk production. °· Apply ice packs to your breasts, unless this is too uncomfortable for you. °· Make sure that your baby is latched on and positioned properly while breastfeeding. ° °If engorgement persists after 48 hours of following these recommendations, contact your health care provider or a lactation consultant. °Overall health care recommendations while breastfeeding °· Eat healthy foods. Alternate between meals and snacks, eating 3 of each per day. Because what you eat affects your breast milk, some of the foods may make your baby more irritable than usual. Avoid eating these foods if you are sure that they are negatively affecting your baby. °· Drink milk, fruit juice, and water to  satisfy your thirst (about 10 glasses a day). °· Rest often, relax, and continue to take your prenatal vitamins to prevent fatigue, stress, and anemia. °· Continue breast self-awareness checks. °· Avoid chewing and smoking tobacco. Chemicals from cigarettes that pass into breast milk and exposure to secondhand smoke may harm your baby. °· Avoid alcohol and drug use, including marijuana. °Some medicines that may be harmful to your baby can pass through breast milk. It is important to ask your health care provider before taking any medicine, including all over-the-counter and prescription medicine as well as vitamin and herbal supplements. °It is possible to become pregnant while breastfeeding. If birth control is desired, ask your health care provider about options that will be safe for your baby. °Contact a health care provider if: °· You feel like you want to stop breastfeeding or have become frustrated with breastfeeding. °· You have painful breasts or nipples. °· Your nipples are cracked or bleeding. °· Your breasts are red, tender, or warm. °· You have a swollen area on either breast. °· You have a fever or chills. °· You have nausea or vomiting. °· You have drainage other than breast milk from your nipples. °· Your breasts do not become full before feedings by the fifth day after you give birth. °· You feel sad and depressed. °· Your baby is too sleepy to eat well. °· Your baby is having trouble sleeping. °· Your baby is wetting less than 3 diapers in a 24-hour period. °· Your baby has less than 3 stools in a 24-hour period. °· Your baby's skin or the white part of his or her eyes becomes yellow. °· Your baby is not gaining weight by 5 days of age. °Get help right away if: °· Your baby is overly tired (lethargic) and does not want to wake up and feed. °· Your baby develops an unexplained fever. °This information is not intended to replace advice given to you by your health care provider. Make sure you discuss  any questions you have with your health care provider. °Document Released: 09/08/2005 Document Revised: 02/20/2016 Document Reviewed: 03/02/2013 °Elsevier Interactive Patient Education © 2017 [employer] °Home Care Instructions for Mom °ACTIVITY °·   Gradually return to your regular activities. °· Let yourself rest. Nap while your baby sleeps. °· Avoid lifting anything that is heavier than 10 lb (4.5 kg) until your health care provider says it is okay. °· Avoid activities that take a lot of effort and energy (are strenuous) until approved by your health care provider. Walking at a slow-to-moderate pace is usually safe. °· If you had a cesarean delivery: °? Do not vacuum, climb stairs, or drive a car for 4-6 weeks. °? Have someone help you at home until you feel like you can do your usual activities yourself. °? Do exercises as told by your health care provider, if this applies. ° °VAGINAL BLEEDING °You may continue to bleed for 4-6 weeks after delivery. Over time, the amount of blood usually decreases and the color of the blood usually gets lighter. However, the flow of bright red blood may increase if you have been too active. If you need to use more than one pad in an hour because your pad gets soaked, or if you pass a large clot: °· Lie down. °· Raise your feet. °· Place a cold compress on your lower abdomen. °· Rest. °· Call your health care provider. ° °If you are breastfeeding, your period should return anytime between 8 weeks after delivery and the time that you stop breastfeeding. If you are not breastfeeding, your period should return 6-8 weeks after delivery. °PERINEAL CARE °The perineal area, or perineum, is the part of your body between your thighs. After delivery, this area needs special care. Follow these instructions as told by your health care provider. °· Take warm tub baths for 15-20 minutes. °· Use medicated pads and pain-relieving sprays and creams as told. °· Do not use tampons or douches until  vaginal bleeding has stopped. °· Each time you go to the bathroom: °? Use a peri bottle. °? Change your pad. °? Use towelettes in place of toilet paper until your stitches have healed. °· Do Kegel exercises every day. Kegel exercises help to maintain the muscles that support the vagina, bladder, and bowels. You can do these exercises while you are standing, sitting, or lying down. To do Kegel exercises: °? Tighten the muscles of your abdomen and the muscles that surround your birth canal. °? Hold for a few seconds. °? Relax. °? Repeat until you have done this 5 times in a row. °· To prevent hemorrhoids from developing or getting worse: °? Drink enough fluid to keep your urine clear or pale yellow. °? Avoid straining when having a bowel movement. °? Take over-the-counter medicines and stool softeners as told by your health care provider. ° °BREAST CARE °· Wear a tight-fitting bra. °· Avoid taking over-the-counter pain medicine for breast discomfort. °· Apply ice to the breasts to help with discomfort as needed: °? Put ice in a plastic bag. °? Place a towel between your skin and the bag. °? Leave the ice on for 20 minutes or as told by your health care provider. ° °NUTRITION °· Eat a well-balanced diet. °· Do not try to lose weight quickly by cutting back on calories. °· Take your prenatal vitamins until your postpartum checkup or until your health care provider tells you to stop. ° °POSTPARTUM DEPRESSION °You may find yourself crying for no apparent reason and unable to cope with all of the changes that come with having a newborn. This mood is called postpartum depression. Postpartum depression happens because your hormone levels change after delivery. If you have   postpartum depression, get support from your partner, friends, and family. If the depression does not go away on its own after several weeks, contact your health care provider. °BREAST SELF-EXAM °Do a breast self-exam each month, at the same time of the  month. If you are breastfeeding, check your breasts just after a feeding, when your breasts are less full. If you are breastfeeding and your period has started, check your breasts on day 5, 6, or 7 of your period. °Report any lumps, bumps, or discharge to your health care provider. Know that breasts are normally lumpy if you are breastfeeding. This is temporary, and it is not a health risk. °INTIMACY AND SEXUALITY °Avoid sexual activity for at least 3-4 weeks after delivery or until the brownish-red vaginal flow is completely gone. If you want to avoid pregnancy, use some form of birth control. You can get pregnant after delivery, even if you have not had your period. °SEEK MEDICAL CARE IF: °· You feel unable to cope with the changes that a child brings to your life, and these feelings do not go away after several weeks. °· You notice a lump, a bump, or discharge on your breast. ° °SEEK IMMEDIATE MEDICAL CARE IF: °· Blood soaks your pad in 1 hour or less. °· You have: °? Severe pain or cramping in your lower abdomen. °? A bad-smelling vaginal discharge. °? A fever that is not controlled by medicine. °? A fever, and an area of your breast is red and sore. °? Pain or redness in your calf. °? Sudden, severe chest pain. °? Shortness of breath. °? Painful or bloody urination. °? Problems with your vision. °· You vomit for 12 hours or longer. °· You develop a severe headache. °· You have serious thoughts about hurting yourself, your child, or anyone else. ° °This information is not intended to replace advice given to you by your health care provider. Make sure you discuss any questions you have with your health care provider. °Document Released: 09/05/2000 Document Revised: 02/14/2016 Document Reviewed: 03/12/2015 °Elsevier Interactive Patient Education © 2017 [employer] ° °

## 2017-01-30 NOTE — Plan of Care (Signed)
Problem: Education: Goal: Knowledge of condition will improve Outcome: Progressing Introduced topic of smoking with infant in the home, pt expressed that she is considering quitting smoking. She requested a nicotine patch while in the hospital to begin the smoking cessation process

## 2017-01-31 NOTE — Progress Notes (Signed)
CSW met with MOB at bedside to confirm MOB had appropriate supports in place and necessary supplies at home to care for baby. MOB reports she has everything she needs and has family/friends in place to support she and baby.   Jeanine Caven, MSW, LCSW-A Clinical Social Worker  [facility]  Office: [phone]

## 2017-01-31 NOTE — Discharge Summary (Signed)
OB Discharge Summary  Patient Name: Caroline Barnes DOB: 03/15/1981 MRN: [id number]  Date of admission: 01/29/2017 Delivering MD: Jacklyn ShellRESENZO-DISHMON, FRANCES   Date of discharge: 01/31/2017  Admitting diagnosis: INDUCTION Intrauterine pregnancy: 4822w0d     Secondary diagnosis:Active Problems:   IUGR (intrauterine growth restriction) affecting care of mother      Discharge diagnosis: Term Pregnancy Delivered        Augmentation: AROM, Cytotec and Foley Balloon  Complications: None  Hospital course:  Induction of Labor With Vaginal Delivery   36 y.o. yo G1P1001 at 6322w0d was admitted to the hospital 01/29/2017 for induction of labor.  Indication for induction: IUGR.  Patient had an uncomplicated labor course as follows: Membrane Rupture Time/Date: 7:45 PM ,01/29/2017   Intrapartum Procedures: Episiotomy: None [1]                                         Lacerations:  None [1]  Patient had delivery of a Viable infant.  Information for the patient's newborn:  [name], Boy [place] [[id number]][[id number]]  Delivery Method: Vag-Spont   01/29/2017  Details of delivery can be found in separate delivery note.  Patient had a routine postpartum course. Patient is discharged home 01/31/17.  Physical exam  Vitals:   01/29/17 2222 01/30/17 0220 01/30/17 1805 01/31/17 0545  BP: 130/66 123/65 126/71 115/81  Pulse: (!) 58 (!) 59 83 67  Resp: 18 18 18 18   Temp: 97.9 F (36.6 C)  98.6 F (37 C) 98 F (36.7 C)  TempSrc: Axillary   Oral  Weight:      Height:       General: alert Lochia: appropriate Uterine Fundus: firm and NT at U-1 Incision: N/A DVT Evaluation: No evidence of DVT seen on physical exam. Labs: Lab Results  Component Value Date   WBC 7.8 01/29/2017   HGB 12.0 01/29/2017   HCT 35.7 (L) 01/29/2017   MCV 82.8 01/29/2017   PLT 295 01/29/2017   CMP Latest Ref Rng & Units 01/29/2017  Glucose 65 - 99 mg/dL 034(V100(H)  BUN 6 - 20 mg/dL 7  Creatinine 4.250.44 - 9.561.00 mg/dL 3.870.72   Sodium 564135 - 332145 mmol/L 131(L)  Potassium 3.5 - 5.1 mmol/L 3.4(L)  Chloride 101 - 111 mmol/L 102  CO2 22 - 32 mmol/L 18(L)  Calcium 8.9 - 10.3 mg/dL 8.3(L)  Total Protein 6.5 - 8.1 g/dL 7.0  Total Bilirubin 0.3 - 1.2 mg/dL 0.6  Alkaline Phos 38 - 126 U/L 96  AST 15 - 41 U/L 25  ALT 14 - 54 U/L 15    Discharge instruction: per After Visit Summary and "Baby and Me Booklet".  After Visit Meds:  Allergies as of 01/31/2017   No Known Allergies     Medication List    TAKE these medications   calcium carbonate 500 MG chewable tablet Commonly known as:  TUMS - dosed in mg elemental calcium Chew 1 tablet by mouth daily.   ibuprofen 600 MG tablet Commonly known as:  ADVIL,MOTRIN Take 1 tablet (600 mg total) by mouth every 6 (six) hours.   Prenatal Vitamins 0.8 MG tablet Take 1 tablet by mouth daily.       Diet: routine diet  Activity: Advance as tolerated. Pelvic rest for 6 weeks.   Outpatient follow up:4 weeks for Nexplanon Follow up Appt:Future Appointments Date Time Provider Department Center  03/04/2017  11:00 AM Cresenzo-Dishmon, Scarlette Calico, CNM [facility]   Follow up visit: No Follow-up on file.  Postpartum contraception: Nexplanon  Newborn Data: Live born female  Birth Weight: 4 lb 14 oz (2210 g) APGAR: 8, 9  Baby Feeding: Breast and bottle Disposition:home with mother   01/31/2017 Allie Bossier, MD

## 2017-02-03 ENCOUNTER — Telehealth: Payer: Self-pay | Admitting: Obstetrics & Gynecology

## 2017-02-04 ENCOUNTER — Ambulatory Visit (INDEPENDENT_AMBULATORY_CARE_PROVIDER_SITE_OTHER): Payer: BLUE CROSS/BLUE SHIELD | Admitting: Obstetrics and Gynecology

## 2017-02-04 ENCOUNTER — Encounter: Payer: Self-pay | Admitting: Obstetrics and Gynecology

## 2017-02-04 VITALS — BP 134/88 | HR 87 | Wt 171.0 lb

## 2017-02-04 DIAGNOSIS — O9122 Nonpurulent mastitis associated with the puerperium: Secondary | ICD-10-CM

## 2017-02-04 MED ORDER — AMOXICILLIN-POT CLAVULANATE 500-125 MG PO TABS: 1.0000 | ORAL_TABLET | Freq: Three times a day (TID) | ORAL | 0 refills | Status: DC

## 2017-02-04 NOTE — Progress Notes (Signed)
Patient ID: Caroline Barnes, female   DOB: 05/27/1981, 36 y.o.   MRN: [id number]    [facility]  @DATE @            Patient name: Caroline Barnes MRN [id number]  Date of birth: 05/29/1981  CC & HPI:   Chief Complaint  Patient presents with  . lump on right breast     Caroline Barnes is a 36 y.o. female presenting today for an area of pain and swelling on the right breast. Pt is currently breastfeeding.   ROS:  ROS +painful area right breast   Pertinent History Reviewed:   Reviewed  Medical         Past Medical History:  Diagnosis Date  . Cellulitis                               Surgical Hx:    Past Surgical History:  Procedure Laterality Date  . NO PAST SURGERIES     Medications: Reviewed & Updated - see associated section                       Current Outpatient Prescriptions:  .  ibuprofen (ADVIL,MOTRIN) 600 MG tablet, Take 1 tablet (600 mg total) by mouth every 6 (six) hours., Disp: 30 tablet, Rfl: 0 .  Prenatal Multivit-Min-Fe-FA (PRENATAL VITAMINS) 0.8 MG tablet, Take 1 tablet by mouth daily., Disp: 30 tablet, Rfl: 12   Social History: Reviewed -  reports that she has been smoking Cigarettes.  She has been smoking about 0.50 packs per day. She has never used smokeless tobacco.  Objective Findings:  Vitals: Blood pressure 134/88, pulse 87, weight 171 lb (77.6 kg), last menstrual period 05/08/2016, unknown if currently breastfeeding.  Physical Examination: General appearance - alert, well appearing, and in no distress Mental status - alert, oriented to person, place, and time Breasts - breasts engorged b/l with ongoing lactation. Upper outer quadrant of R breast has slight tenderness and erythema. No local heat to touch.    Assessment & Plan:   A:  1. ? Mastitis  Vs milk duct clogged   P:  1. Rx Augmentin, recommended local heat and massage  2. Continue breastfeeding     By signing my name below, I, Doreatha MartinEva Mathews, attest  that this documentation has been prepared under the direction and in the presence of Tilda BurrowFerguson, Karielle Davidow V, MD. Electronically Signed: Doreatha MartinEva Mathews, ED Scribe. 02/04/17. 2:02 PM.  I personally performed the services described in this documentation, which was SCRIBED in my presence. The recorded information has been reviewed and considered accurate. It has been edited as necessary during review. Tilda BurrowFERGUSON,Cyrena Kuchenbecker V, MD

## 2017-02-04 NOTE — Telephone Encounter (Signed)
Spoke to patient and informed her that [facility] States she has appointment tomorrow so I advised her to call me back if she has any other problems or questions. Verbalized understanding.

## 2017-02-23 ENCOUNTER — Other Ambulatory Visit (HOSPITAL_COMMUNITY): Payer: Self-pay | Admitting: Obstetrics & Gynecology

## 2017-03-04 ENCOUNTER — Ambulatory Visit (INDEPENDENT_AMBULATORY_CARE_PROVIDER_SITE_OTHER): Payer: BLUE CROSS/BLUE SHIELD | Admitting: Advanced Practice Midwife

## 2017-03-04 ENCOUNTER — Encounter: Payer: Self-pay | Admitting: Advanced Practice Midwife

## 2017-03-04 MED ORDER — IBUPROFEN 600 MG PO TABS: 600.0000 mg | ORAL_TABLET | Freq: Four times a day (QID) | ORAL | 2 refills | Status: DC

## 2017-03-04 NOTE — Progress Notes (Signed)
Caroline HinesLashonda P Barnes is a 36 y.o. who presents for a postpartum visit. She is 4 weeks postpartum following a spontaneous vaginal delivery. I have fully reviewed the prenatal and intrapartum course. The delivery was at 38 gestational weeks. , IOL for IUGR Anesthesia: none. Postpartum course has been uneventful. Baby's has been uneventful. Baby is feeding by bottle and a little pumped brest ,o;d. Bleeding: staining only. Bowel function is normal. Bladder function is normal. Patient is not sexually active. Contraception method is condoms. Postpartum depression screening: negative.   Current Outpatient Prescriptions:  .  Prenatal Multivit-Min-Fe-FA (PRENATAL VITAMINS) 0.8 MG tablet, Take 1 tablet by mouth daily., Disp: 30 tablet, Rfl: 12 .  amoxicillin-clavulanate (AUGMENTIN) 500-125 MG tablet, Take 1 tablet (500 mg total) by mouth 3 (three) times daily. (Patient not taking: Reported on 03/04/2017), Disp: 21 tablet, Rfl: 0 .  ibuprofen (ADVIL,MOTRIN) 600 MG tablet, Take 1 tablet (600 mg total) by mouth every 6 (six) hours. (Patient not taking: Reported on 03/04/2017), Disp: 30 tablet, Rfl: 0  Review of Systems   Constitutional: Negative for fever and chills Eyes: Negative for visual disturbances Respiratory: Negative for shortness of breath, dyspnea Cardiovascular: Negative for chest pain or palpitations  Gastrointestinal: Negative for vomiting, diarrhea and constipation Genitourinary: Negative for dysuria and urgency Musculoskeletal: Negative for back pain, joint pain, myalgias  Neurological: Negative for dizziness and headaches    Objective:     Vitals:   03/04/17 1102  BP: 120/90  Pulse: 68   General:  alert, cooperative and no distress   Breasts:  negative  Lungs: clear to auscultation bilaterally  Heart:  regular rate and rhythm  Abdomen: Soft, nontender   Vulva:  normal  Vagina: normal vagina  Cervix:  closed  Corpus: Well involuted     Rectal Exam: no hemorrhoids         Assessment:    normal postpartum exam.  Plan:   1. Contraception: condoms 2. Follow up in:   or as needed.

## 2017-03-05 DIAGNOSIS — Z23 Encounter for immunization: Secondary | ICD-10-CM | POA: Diagnosis not present

## 2017-03-05 DIAGNOSIS — Z00129 Encounter for routine child health examination without abnormal findings: Secondary | ICD-10-CM | POA: Diagnosis not present

## 2017-04-16 DIAGNOSIS — Z029 Encounter for administrative examinations, unspecified: Secondary | ICD-10-CM

## 2017-06-22 ENCOUNTER — Encounter (HOSPITAL_COMMUNITY): Payer: Self-pay | Admitting: *Deleted

## 2017-06-22 DIAGNOSIS — R197 Diarrhea, unspecified: Secondary | ICD-10-CM | POA: Insufficient documentation

## 2017-06-22 DIAGNOSIS — E860 Dehydration: Secondary | ICD-10-CM | POA: Diagnosis not present

## 2017-06-22 DIAGNOSIS — Z79899 Other long term (current) drug therapy: Secondary | ICD-10-CM | POA: Diagnosis not present

## 2017-06-22 DIAGNOSIS — F1010 Alcohol abuse, uncomplicated: Secondary | ICD-10-CM | POA: Diagnosis not present

## 2017-06-22 DIAGNOSIS — K2901 Acute gastritis with bleeding: Secondary | ICD-10-CM | POA: Diagnosis not present

## 2017-06-22 DIAGNOSIS — F17210 Nicotine dependence, cigarettes, uncomplicated: Secondary | ICD-10-CM

## 2017-06-22 DIAGNOSIS — R1110 Vomiting, unspecified: Secondary | ICD-10-CM | POA: Diagnosis present

## 2017-06-22 DIAGNOSIS — R112 Nausea with vomiting, unspecified: Secondary | ICD-10-CM | POA: Diagnosis not present

## 2017-06-22 NOTE — ED Triage Notes (Signed)
Pt c/o vomiting that started today after eating [employer]; pt is c/o all over body aches with fever

## 2017-06-23 ENCOUNTER — Emergency Department (HOSPITAL_COMMUNITY)
Admission: EM | Admit: 2017-06-23 | Discharge: 2017-06-23 | Disposition: A | Discharge status: Home / Self Care | Payer: BLUE CROSS/BLUE SHIELD | Source: Home / Self Care | Attending: Emergency Medicine | Admitting: Emergency Medicine

## 2017-06-23 ENCOUNTER — Encounter (HOSPITAL_COMMUNITY): Payer: Self-pay

## 2017-06-23 ENCOUNTER — Emergency Department (HOSPITAL_COMMUNITY)
Admission: EM | Admit: 2017-06-23 | Discharge: 2017-06-23 | Disposition: A | Discharge status: Home / Self Care | Payer: BLUE CROSS/BLUE SHIELD | Attending: Emergency Medicine | Admitting: Emergency Medicine

## 2017-06-23 DIAGNOSIS — R112 Nausea with vomiting, unspecified: Secondary | ICD-10-CM | POA: Diagnosis not present

## 2017-06-23 DIAGNOSIS — F17210 Nicotine dependence, cigarettes, uncomplicated: Secondary | ICD-10-CM | POA: Insufficient documentation

## 2017-06-23 DIAGNOSIS — K2901 Acute gastritis with bleeding: Secondary | ICD-10-CM | POA: Insufficient documentation

## 2017-06-23 DIAGNOSIS — F1010 Alcohol abuse, uncomplicated: Secondary | ICD-10-CM | POA: Insufficient documentation

## 2017-06-23 DIAGNOSIS — R197 Diarrhea, unspecified: Principal | ICD-10-CM

## 2017-06-23 DIAGNOSIS — E860 Dehydration: Secondary | ICD-10-CM | POA: Insufficient documentation

## 2017-06-23 DIAGNOSIS — Z79899 Other long term (current) drug therapy: Secondary | ICD-10-CM | POA: Insufficient documentation

## 2017-06-23 LAB — URINALYSIS, ROUTINE W REFLEX MICROSCOPIC
BILIRUBIN URINE: NEGATIVE
Glucose, UA: NEGATIVE mg/dL
Hgb urine dipstick: NEGATIVE
KETONES UR: 20 mg/dL — AB
Nitrite: NEGATIVE
PROTEIN: 30 mg/dL — AB
SPECIFIC GRAVITY, URINE: 1.029 (ref 1.005–1.030)
pH: 5 (ref 5.0–8.0)

## 2017-06-23 LAB — COMPREHENSIVE METABOLIC PANEL
ALBUMIN: 3.8 g/dL (ref 3.5–5.0)
ALK PHOS: 55 U/L (ref 38–126)
ALT: 24 U/L (ref 14–54)
ANION GAP: 9 (ref 5–15)
AST: 19 U/L (ref 15–41)
BILIRUBIN TOTAL: 0.9 mg/dL (ref 0.3–1.2)
BUN: 15 mg/dL (ref 6–20)
CO2: 23 mmol/L (ref 22–32)
CREATININE: 0.78 mg/dL (ref 0.44–1.00)
Calcium: 8.2 mg/dL — ABNORMAL LOW (ref 8.9–10.3)
Chloride: 104 mmol/L (ref 101–111)
GFR calc non Af Amer: >60 mL/min (ref >60)
GLUCOSE: 99 mg/dL (ref 65–99)
Potassium: 3.5 mmol/L (ref 3.5–5.1)
SODIUM: 136 mmol/L (ref 135–145)
Total Protein: 7.5 g/dL (ref 6.5–8.1)

## 2017-06-23 LAB — CBC WITH DIFFERENTIAL/PLATELET
BASOS PCT: 0 %
Basophils Absolute: 0 10*3/uL (ref 0.0–0.1)
EOS ABS: 0 10*3/uL (ref 0.0–0.7)
Eosinophils Relative: 1 %
HCT: 41.9 % (ref 36.0–46.0)
HEMOGLOBIN: 13.9 g/dL (ref 12.0–15.0)
LYMPHS ABS: 0.2 10*3/uL — AB (ref 0.7–4.0)
Lymphocytes Relative: 6 %
MCH: 27.4 pg (ref 26.0–34.0)
MCHC: 33.2 g/dL (ref 30.0–36.0)
MCV: 82.6 fL (ref 78.0–100.0)
MONO ABS: 0.3 10*3/uL (ref 0.1–1.0)
MONOS PCT: 7 %
Neutro Abs: 3.7 10*3/uL (ref 1.7–7.7)
Neutrophils Relative %: 86 %
Platelets: 270 10*3/uL (ref 150–400)
RBC: 5.07 MIL/uL (ref 3.87–5.11)
RDW: 13.9 % (ref 11.5–15.5)
WBC: 4.2 10*3/uL (ref 4.0–10.5)

## 2017-06-23 LAB — POC URINE PREG, ED: Preg Test, Ur: NEGATIVE

## 2017-06-23 MED ORDER — SODIUM CHLORIDE 0.9 % IV BOLUS (SEPSIS)
1000.0000 mL | Freq: Once | INTRAVENOUS | Status: AC
  Administered 2017-06-23: 1000 mL via INTRAVENOUS

## 2017-06-23 MED ORDER — IBUPROFEN 800 MG PO TABS
800.0000 mg | ORAL_TABLET | Freq: Once | ORAL | Status: AC
  Administered 2017-06-23: 800 mg via ORAL
  Filled 2017-06-23: qty 1

## 2017-06-23 MED ORDER — ONDANSETRON 8 MG PO TBDP: 8.0000 mg | ORAL_TABLET | Freq: Three times a day (TID) | ORAL | 0 refills | Status: DC | PRN

## 2017-06-23 MED ORDER — ONDANSETRON HCL 8 MG PO TABS: 8.0000 mg | ORAL_TABLET | ORAL | 0 refills | Status: DC | PRN

## 2017-06-23 MED ORDER — PANTOPRAZOLE SODIUM 20 MG PO TBEC: 20.0000 mg | DELAYED_RELEASE_TABLET | Freq: Two times a day (BID) | ORAL | 0 refills | Status: DC

## 2017-06-23 MED ORDER — ONDANSETRON HCL 4 MG/2ML IJ SOLN
4.0000 mg | Freq: Once | INTRAMUSCULAR | Status: AC
  Administered 2017-06-23: 4 mg via INTRAVENOUS
  Filled 2017-06-23: qty 2

## 2017-06-23 MED ORDER — PANTOPRAZOLE SODIUM 40 MG IV SOLR
40.0000 mg | Freq: Once | INTRAVENOUS | Status: AC
  Administered 2017-06-23: 40 mg via INTRAVENOUS
  Filled 2017-06-23: qty 40

## 2017-06-23 MED ORDER — ONDANSETRON 8 MG PO TBDP
8.0000 mg | ORAL_TABLET | Freq: Once | ORAL | Status: AC
  Administered 2017-06-23: 8 mg via ORAL
  Filled 2017-06-23: qty 1

## 2017-06-23 NOTE — ED Notes (Signed)
Pt has tolerated 2 cups of water, has rested.  No further vomiting.  Gave pt third cup of water.

## 2017-06-23 NOTE — ED Notes (Signed)
Pt states that she feels better.

## 2017-06-23 NOTE — ED Notes (Signed)
Pt ambulates to the bathroom to void.  

## 2017-06-23 NOTE — ED Notes (Signed)
Pt states she was able to keep down sprite and crackers. States the throwing up has subsided, but she feels like she now just has diarrhea.

## 2017-06-23 NOTE — ED Provider Notes (Signed)
[facility] Provider Note   CSN: [id number] Arrival date & time: 06/22/17  2257     History   Chief Complaint Chief Complaint  Patient presents with  . Emesis    HPI Caroline Barnes is a 36 y.o. female.  The history is provided by the patient.  Emesis   This is a new problem. The current episode started 3 to 5 hours ago. The problem occurs 2 to 4 times per day. The problem has been gradually improving. The maximum temperature recorded prior to her arrival was 100 to 100.9 F. Associated symptoms include abdominal pain, chills, cough, diarrhea, a fever and myalgias. Risk factors include suspect food intake and ill contacts.   Pt reports soon after eating dinner and having an alcoholic drink she began having nausea/vomiting/diarrhea, all has been nonbloody She also reports abd pain No travel +sick contacts  Past Medical History:  Diagnosis Date  . Cellulitis     Patient Active Problem List   Diagnosis Date Noted  . IUGR (intrauterine growth restriction) affecting care of mother 01/29/2017  . Supervision of normal first pregnancy 07/28/2016  . Abnormal Pap smear of cervix 07/28/2016  . Smoker 07/28/2016  . Cellulitis of right leg 07/28/2015    Past Surgical History:  Procedure Laterality Date  . NO PAST SURGERIES      OB History    Gravida Para Term Preterm AB Living   0 0 1   SAB TAB Ectopic Multiple Live Births   0 0 0 0 1       Home Medications    Prior to Admission medications   Medication Sig Start Date End Date Taking? Authorizing Provider  ibuprofen (ADVIL,MOTRIN) 600 MG tablet Take 1 tablet (600 mg total) by mouth every 6 (six) hours. 03/04/17   Cresenzo-Dishmon, Scarlette Calico, CNM  Prenatal Multivit-Min-Fe-FA (PRENATAL VITAMINS) 0.8 MG tablet Take 1 tablet by mouth daily. 01/30/17   Allie Bossier, MD    Family History Family History  Problem Relation Age of Onset  . Rashes / Skin problems Cousin        Contact dermatitis  . Cancer  Cousin        breast  . Thyroid cancer Mother   . Diabetes Mother   . Cancer Mother        breast  . Diabetes Maternal Aunt   . Diabetes Maternal Uncle   . Diabetes Paternal Aunt   . Stroke Paternal Aunt   . Diabetes Paternal Uncle   . Cancer Paternal Grandmother     Social History Social History  Substance Use Topics  . Smoking status: Current Every Day Smoker    Packs/day: 0.50    Types: Cigarettes  . Smokeless tobacco: Never Used  . Alcohol use No     Comment: pint of vodka./ day     Allergies   Patient has no known allergies.   Review of Systems Review of Systems  Constitutional: Positive for chills and fever.  Respiratory: Positive for cough.   Gastrointestinal: Positive for abdominal pain, diarrhea and vomiting. Negative for blood in stool.  Musculoskeletal: Positive for myalgias.  All other systems reviewed and are negative.    Physical Exam Updated Vital Signs BP (!) 155/89   Pulse (!) 118   Temp (!) 101.3 F (38.5 C) (Oral)   Resp 16   Ht 1.676 m ( )   Wt 80.3 kg (177 lb)   LMP 06/04/2017   SpO2 100%   BMI  28.57 kg/m   Physical Exam CONSTITUTIONAL: Well developed/well nourished HEAD: Normocephalic/atraumatic EYES: EOMI/PERRL, no icterus ENMT: Mucous membranes moist NECK: supple no meningeal signs SPINE/BACK:entire spine nontender CV: S1/S2 noted, no murmurs/rubs/gallops noted LUNGS: Lungs are clear to auscultation bilaterally, no apparent distress ABDOMEN: soft, nontender, no rebound or guarding, bowel sounds noted throughout abdomen GU:no cva tenderness NEURO: Pt is awake/alert/appropriate, moves all extremitiesx4.  No facial droop.   EXTREMITIES: pulses normal/equal, full ROM SKIN: warm, color normal PSYCH: no abnormalities of mood noted, alert and oriented to situation   ED Treatments / Results  Labs (all labs ordered are listed, but only abnormal results are displayed) Labs Reviewed  URINALYSIS, ROUTINE W REFLEX MICROSCOPIC -  Abnormal; Notable for the following:       Result Value   APPearance CLOUDY (*)    Ketones, ur 20 (*)    Protein, ur 30 (*)    Leukocytes, UA MODERATE (*)    Bacteria, UA RARE (*)    Squamous Epithelial / LPF TOO NUMEROUS TO COUNT (*)    All other components within normal limits  POC URINE PREG, ED    EKG  EKG Interpretation None       Radiology No results found.  Procedures Procedures (including critical care time)  Medications Ordered in ED Medications  ibuprofen (ADVIL,MOTRIN) tablet 800 mg (800 mg Oral Given 06/23/17 0123)  ondansetron (ZOFRAN-ODT) disintegrating tablet 8 mg (8 mg Oral Given 06/23/17 0158)     Initial Impression / Assessment and Plan / ED Course  I have reviewed the triage vital signs and the nursing notes.  Pertinent labs  results that were available during my care of the patient were reviewed by me and considered in my medical decision making (see chart for details).     2:44 AM Suspect viral illness Will give PO challenge 3:31 AM Pt improved Taking PO She feels comfortable for d/c home Final Clinical Impressions(s) / ED Diagnoses   Final diagnoses:  Nausea vomiting and diarrhea    New Prescriptions New Prescriptions   ONDANSETRON (ZOFRAN ODT) 8 MG DISINTEGRATING TABLET    Take 1 tablet (8 mg total) by mouth every 8 (eight) hours as needed.     Zadie Rhine, MD 06/23/17 [phone]

## 2017-06-23 NOTE — ED Notes (Signed)
Pt drank first cup of ice water I gave her when she had her ibuprofen.  Second cup pf ice water given which pt also drank.  No emesis since she drank

## 2017-06-23 NOTE — ED Provider Notes (Signed)
[facility] Provider Note   CSN: [id number] Arrival date & time: 06/23/17  4098     History   Chief Complaint Chief Complaint  Patient presents with  . Emesis    HPI Caroline Barnes is a 36 y.o. female.  Nausea, vomiting, diarrhea for the past 24 hours. She was seen in emergency department approximately 2 AM and discharged home. She has had persistent symptoms. Slight blood-tinged emesis. She smokes half a pack per day and drinks a fifth of alcohol on the weekend.  No blood in the stool. Severity of symptoms is moderate. Nothing makes symptoms better or worse.      Past Medical History:  Diagnosis Date  . Cellulitis     Patient Active Problem List   Diagnosis Date Noted  . IUGR (intrauterine growth restriction) affecting care of mother 01/29/2017  . Supervision of normal first pregnancy 07/28/2016  . Abnormal Pap smear of cervix 07/28/2016  . Smoker 07/28/2016  . Cellulitis of right leg 07/28/2015    Past Surgical History:  Procedure Laterality Date  . NO PAST SURGERIES      OB History    Gravida Para Term Preterm AB Living   0 0 1   SAB TAB Ectopic Multiple Live Births   0 0 0 0 1       Home Medications    Prior to Admission medications   Medication Sig Start Date End Date Taking? Authorizing Provider  ibuprofen (ADVIL,MOTRIN) 600 MG tablet Take 1 tablet (600 mg total) by mouth every 6 (six) hours. Patient not taking: Reported on 06/23/2017 03/04/17   Cresenzo-Dishmon, Scarlette Calico, CNM  ondansetron (ZOFRAN ODT) 8 MG disintegrating tablet Take 1 tablet (8 mg total) by mouth every 8 (eight) hours as needed. Patient not taking: Reported on 06/23/2017 06/23/17   Zadie Rhine, MD  ondansetron (ZOFRAN) 8 MG tablet Take 1 tablet (8 mg total) by mouth every 4 (four) hours as needed. 06/23/17   Donnetta Hutching, MD  pantoprazole (PROTONIX) 20 MG tablet Take 1 tablet (20 mg total) by mouth 2 (two) times daily. 06/23/17   Donnetta Hutching, MD  Prenatal  Multivit-Min-Fe-FA (PRENATAL VITAMINS) 0.8 MG tablet Take 1 tablet by mouth daily. Patient not taking: Reported on 06/23/2017 01/30/17   Allie Bossier, MD    Family History Family History  Problem Relation Age of Onset  . Rashes / Skin problems Cousin        Contact dermatitis  . Cancer Cousin        breast  . Thyroid cancer Mother   . Diabetes Mother   . Cancer Mother        breast  . Diabetes Maternal Aunt   . Diabetes Maternal Uncle   . Diabetes Paternal Aunt   . Stroke Paternal Aunt   . Diabetes Paternal Uncle   . Cancer Paternal Grandmother     Social History Social History  Substance Use Topics  . Smoking status: Current Every Day Smoker    Packs/day: 0.50    Types: Cigarettes  . Smokeless tobacco: Never Used  . Alcohol use Yes     Comment: 2 or 3 days per week.     Allergies   Patient has no known allergies.   Review of Systems Review of Systems  All other systems reviewed and are negative.    Physical Exam Updated Vital Signs BP 129/90   Pulse 84   Temp 98.7 F (37.1 C) (Oral)   Resp 18  Ht  (1.676 m)   Wt 80.3 kg (177 lb)   LMP 06/04/2017   SpO2 100%   Breastfeeding? No   BMI 28.57 kg/m   Physical Exam  Constitutional: She is oriented to person, place, and time.  Slightly dehydrated, no acute distress  HENT:  Head: Normocephalic and atraumatic.  Eyes: Conjunctivae are normal.  Neck: Neck supple.  Cardiovascular: Normal rate and regular rhythm.   Pulmonary/Chest: Effort normal and breath sounds normal.  Abdominal: Soft. Bowel sounds are normal.  Musculoskeletal: Normal range of motion.  Neurological: She is alert and oriented to person, place, and time.  Skin: Skin is warm and dry.  Psychiatric: She has a normal mood and affect. Her behavior is normal.  Nursing note and vitals reviewed.    ED Treatments / Results  Labs (all labs ordered are listed, but only abnormal results are displayed) Labs Reviewed  CBC WITH  DIFFERENTIAL/PLATELET - Abnormal; Notable for the following:       Result Value   Lymphs Abs 0.2 (*)    All other components within normal limits  COMPREHENSIVE METABOLIC PANEL - Abnormal; Notable for the following:    Calcium 8.2 (*)    All other components within normal limits    EKG  EKG Interpretation None       Radiology No results found.  Procedures Procedures (including critical care time)  Medications Ordered in ED Medications  sodium chloride 0.9 % bolus 1,000 mL (0 mLs Intravenous Stopped 06/23/17 1125)  ondansetron (ZOFRAN) injection 4 mg (4 mg Intravenous Given 06/23/17 0823)  sodium chloride 0.9 % bolus 1,000 mL (0 mLs Intravenous Stopped 06/23/17 1125)  pantoprazole (PROTONIX) injection 40 mg (40 mg Intravenous Given 06/23/17 0823)     Initial Impression / Assessment and Plan / ED Course  I have reviewed the triage vital signs and the nursing notes.  Pertinent labs & imaging results that were available during my care of the patient were reviewed by me and considered in my medical decision making (see chart for details).     I suspect patient has a form of gastritis with minimal blood-tinged emesis.  Labs are reassuring. She feels much better after IV fluids, IV Zofran, IV Protonix.  Final Clinical Impressions(s) / ED Diagnoses   Final diagnoses:  Acute gastritis with hemorrhage, unspecified gastritis type  Nausea vomiting and diarrhea    New Prescriptions New Prescriptions   ONDANSETRON (ZOFRAN) 8 MG TABLET    Take 1 tablet (8 mg total) by mouth every 4 (four) hours as needed.   PANTOPRAZOLE (PROTONIX) 20 MG TABLET    Take 1 tablet (20 mg total) by mouth 2 (two) times daily.     Donnetta Hutching, MD 06/23/17 1236

## 2017-06-23 NOTE — Discharge Instructions (Signed)

## 2017-06-23 NOTE — Discharge Instructions (Signed)
Clear liquids today. Prescription for nausea medicine and medication to settle down your stomach. Return if worse. Alcohol and tobacco attribute to this problem.

## 2017-06-23 NOTE — ED Notes (Signed)
Pt has been given sprite and crackers

## 2017-06-23 NOTE — ED Triage Notes (Signed)
Pt reports was diagnosed with a stomach virus here in ed yesterday and reports noticed blood in emesis last night.  Pt says she thinks it was streaks of blood in emesis but isn't sure because she was vomiting outside and it was dark.  Pt reports she vomited twice.  Also reports diarrhea but denies seeing any blood in her stool.

## 2017-09-20 DIAGNOSIS — O26891 Other specified pregnancy related conditions, first trimester: Secondary | ICD-10-CM | POA: Diagnosis not present

## 2017-09-20 DIAGNOSIS — R109 Unspecified abdominal pain: Secondary | ICD-10-CM | POA: Diagnosis not present

## 2017-09-20 DIAGNOSIS — Z3201 Encounter for pregnancy test, result positive: Secondary | ICD-10-CM | POA: Diagnosis not present

## 2017-09-20 DIAGNOSIS — Z3A01 Less than 8 weeks gestation of pregnancy: Secondary | ICD-10-CM | POA: Diagnosis not present

## 2017-09-20 DIAGNOSIS — F17200 Nicotine dependence, unspecified, uncomplicated: Secondary | ICD-10-CM | POA: Diagnosis not present

## 2017-09-22 NOTE — L&D Delivery Note (Signed)
Patient is 37 y.o. G2P1001 [id number] admitted  For  IOL for IUGR, oligohydramnios. S/p IOL with foley bulb, followed by Pitocin AROM at 2145.  Prenatal course also complicated by IUGR and oligo.  Delivery Note At 12:41 AM a viable female was delivered via Vaginal, Spontaneous (Presentation: LOA ;cephalic  ).  APGAR: 8, 9; weight 3 lb 12.9 oz (1725 g).   Placenta status: intact, small, sent to pathology . 3V Cord:    Head delivered LOA. Precipitous delivery. No nuchal cord present. Shoulder and body delivered in usual fashion. Infant with spontaneous cry, placed on mother's abdomen, dried and bulb suctioned. Cord clamped x 2 after 1-minute delay, and cut by PA student. NICU came to room due to IUGR, decided to take infant to NICU. Cord blood drawn. Placenta delivered spontaneously with gentle cord traction. Fundus firm with massage and Pitocin. Perineum inspected and found to have zero laceration, with few abrasions that did not need suture repair.   Anesthesia:  epidural Episiotomy: None Lacerations:   Suture Repair: n/a Est. Blood Loss (mL): 25  Mom to postpartum.  Baby to NICU.  Sandre KittyDaniel K Olson 04/29/2018, 2:23 AM

## 2017-10-06 ENCOUNTER — Other Ambulatory Visit: Payer: Self-pay | Admitting: Obstetrics and Gynecology

## 2017-10-06 DIAGNOSIS — O3680X0 Pregnancy with inconclusive fetal viability, not applicable or unspecified: Secondary | ICD-10-CM

## 2017-10-07 ENCOUNTER — Ambulatory Visit (INDEPENDENT_AMBULATORY_CARE_PROVIDER_SITE_OTHER): Payer: BLUE CROSS/BLUE SHIELD

## 2017-10-07 DIAGNOSIS — Z3A08 8 weeks gestation of pregnancy: Secondary | ICD-10-CM

## 2017-10-07 DIAGNOSIS — O3680X0 Pregnancy with inconclusive fetal viability, not applicable or unspecified: Secondary | ICD-10-CM

## 2017-10-07 NOTE — Progress Notes (Signed)
US 8 wks,single IUP w/ys,positive fht 157 bpm,normal ovaries bilat,crl 15.38 mm,EDD 05/19/2018 by LMP

## 2017-10-20 ENCOUNTER — Ambulatory Visit (INDEPENDENT_AMBULATORY_CARE_PROVIDER_SITE_OTHER): Payer: BLUE CROSS/BLUE SHIELD | Admitting: Advanced Practice Midwife

## 2017-10-20 ENCOUNTER — Ambulatory Visit: Payer: BLUE CROSS/BLUE SHIELD | Admitting: *Deleted

## 2017-10-20 ENCOUNTER — Encounter: Payer: Self-pay | Admitting: Advanced Practice Midwife

## 2017-10-20 ENCOUNTER — Other Ambulatory Visit (HOSPITAL_COMMUNITY)
Admission: RE | Admit: 2017-10-20 | Discharge: 2017-10-20 | Disposition: A | Discharge status: Home / Self Care | Payer: BLUE CROSS/BLUE SHIELD | Source: Ambulatory Visit | Attending: Advanced Practice Midwife | Admitting: Advanced Practice Midwife

## 2017-10-20 VITALS — BP 110/56 | HR 88 | Wt 183.0 lb

## 2017-10-20 DIAGNOSIS — Z331 Pregnant state, incidental: Secondary | ICD-10-CM

## 2017-10-20 DIAGNOSIS — O365930 Maternal care for other known or suspected poor fetal growth, third trimester, not applicable or unspecified: Secondary | ICD-10-CM | POA: Diagnosis not present

## 2017-10-20 DIAGNOSIS — Z3481 Encounter for supervision of other normal pregnancy, first trimester: Secondary | ICD-10-CM | POA: Insufficient documentation

## 2017-10-20 DIAGNOSIS — O99331 Smoking (tobacco) complicating pregnancy, first trimester: Secondary | ICD-10-CM

## 2017-10-20 DIAGNOSIS — O99321 Drug use complicating pregnancy, first trimester: Secondary | ICD-10-CM

## 2017-10-20 DIAGNOSIS — Z124 Encounter for screening for malignant neoplasm of cervix: Secondary | ICD-10-CM | POA: Diagnosis not present

## 2017-10-20 DIAGNOSIS — O0990 Supervision of high risk pregnancy, unspecified, unspecified trimester: Secondary | ICD-10-CM | POA: Insufficient documentation

## 2017-10-20 DIAGNOSIS — Z8759 Personal history of other complications of pregnancy, childbirth and the puerperium: Secondary | ICD-10-CM

## 2017-10-20 DIAGNOSIS — F17210 Nicotine dependence, cigarettes, uncomplicated: Secondary | ICD-10-CM

## 2017-10-20 DIAGNOSIS — O09899 Supervision of other high risk pregnancies, unspecified trimester: Secondary | ICD-10-CM

## 2017-10-20 DIAGNOSIS — Z1389 Encounter for screening for other disorder: Secondary | ICD-10-CM

## 2017-10-20 DIAGNOSIS — Z3A09 9 weeks gestation of pregnancy: Secondary | ICD-10-CM | POA: Diagnosis not present

## 2017-10-20 DIAGNOSIS — Z3A36 36 weeks gestation of pregnancy: Secondary | ICD-10-CM | POA: Diagnosis not present

## 2017-10-20 DIAGNOSIS — F1590 Other stimulant use, unspecified, uncomplicated: Secondary | ICD-10-CM

## 2017-10-20 DIAGNOSIS — O09291 Supervision of pregnancy with other poor reproductive or obstetric history, first trimester: Secondary | ICD-10-CM

## 2017-10-20 LAB — POCT URINALYSIS DIPSTICK
Glucose, UA: NEGATIVE
KETONES UA: NEGATIVE
LEUKOCYTES UA: NEGATIVE
NITRITE UA: NEGATIVE
PROTEIN UA: NEGATIVE
RBC UA: NEGATIVE

## 2017-10-20 NOTE — Patient Instructions (Signed)
 First Trimester of Pregnancy The first trimester of pregnancy is from week 1 until the end of week 12 (months 1 through 3). A week after a sperm fertilizes an egg, the egg will implant on the wall of the uterus. This embryo will begin to develop into a baby. Genes from you and your partner are forming the baby. The female genes determine whether the baby is a boy or a girl. At 6-8 weeks, the eyes and face are formed, and the heartbeat can be seen on ultrasound. At the end of 12 weeks, all the baby's organs are formed.  Now that you are pregnant, you will want to do everything you can to have a healthy baby. Two of the most important things are to get good prenatal care and to follow your health care provider's instructions. Prenatal care is all the medical care you receive before the baby's birth. This care will help prevent, find, and treat any problems during the pregnancy and childbirth. BODY CHANGES Your body goes through many changes during pregnancy. The changes vary from woman to woman.   You may gain or lose a couple of pounds at first.  You may feel sick to your stomach (nauseous) and throw up (vomit). If the vomiting is uncontrollable, call your health care provider.  You may tire easily.  You may develop headaches that can be relieved by medicines approved by your health care provider.  You may urinate more often. Painful urination may mean you have a bladder infection.  You may develop heartburn as a result of your pregnancy.  You may develop constipation because certain hormones are causing the muscles that push waste through your intestines to slow down.  You may develop hemorrhoids or swollen, bulging veins (varicose veins).  Your breasts may begin to grow larger and become tender. Your nipples may stick out more, and the tissue that surrounds them (areola) may become darker.  Your gums may bleed and may be sensitive to brushing and flossing.  Dark spots or blotches  (chloasma, mask of pregnancy) may develop on your face. This will likely fade after the baby is born.  Your menstrual periods will stop.  You may have a loss of appetite.  You may develop cravings for certain kinds of food.  You may have changes in your emotions from day to day, such as being excited to be pregnant or being concerned that something may go wrong with the pregnancy and baby.  You may have more vivid and strange dreams.  You may have changes in your hair. These can include thickening of your hair, rapid growth, and changes in texture. Some women also have hair loss during or after pregnancy, or hair that feels dry or thin. Your hair will most likely return to normal after your baby is born. WHAT TO EXPECT AT YOUR PRENATAL VISITS During a routine prenatal visit:  You will be weighed to make sure you and the baby are growing normally.  Your blood pressure will be taken.  Your abdomen will be measured to track your baby's growth.  The fetal heartbeat will be listened to starting around week 10 or 12 of your pregnancy.  Test results from any previous visits will be discussed. Your health care provider may ask you:  How you are feeling.  If you are feeling the baby move.  If you have had any abnormal symptoms, such as leaking fluid, bleeding, severe headaches, or abdominal cramping.  If you have any questions. Other   tests that may be performed during your first trimester include:  Blood tests to find your blood type and to check for the presence of any previous infections. They will also be used to check for low iron levels (anemia) and Rh antibodies. Later in the pregnancy, blood tests for diabetes will be done along with other tests if problems develop.  Urine tests to check for infections, diabetes, or protein in the urine.  An ultrasound to confirm the proper growth and development of the baby.  An amniocentesis to check for possible genetic problems.  Fetal  screens for spina bifida and Down syndrome.  You may need other tests to make sure you and the baby are doing well. HOME CARE INSTRUCTIONS  Medicines  Follow your health care provider's instructions regarding medicine use. Specific medicines may be either safe or unsafe to take during pregnancy.  Take your prenatal vitamins as directed.  If you develop constipation, try taking a stool softener if your health care provider approves. Diet  Eat regular, well-balanced meals. Choose a variety of foods, such as meat or vegetable-based protein, fish, milk and low-fat dairy products, vegetables, fruits, and whole grain breads and cereals. Your health care provider will help you determine the amount of weight gain that is right for you.  Avoid raw meat and uncooked cheese. These carry germs that can cause birth defects in the baby.  Eating four or five small meals rather than three large meals a day may help relieve nausea and vomiting. If you start to feel nauseous, eating a few soda crackers can be helpful. Drinking liquids between meals instead of during meals also seems to help nausea and vomiting.  If you develop constipation, eat more high-fiber foods, such as fresh vegetables or fruit and whole grains. Drink enough fluids to keep your urine clear or pale yellow. Activity and Exercise  Exercise only as directed by your health care provider. Exercising will help you:  Control your weight.  Stay in shape.  Be prepared for labor and delivery.  Experiencing pain or cramping in the lower abdomen or low back is a good sign that you should stop exercising. Check with your health care provider before continuing normal exercises.  Try to avoid standing for long periods of time. Move your legs often if you must stand in one place for a long time.  Avoid heavy lifting.  Wear low-heeled shoes, and practice good posture.  You may continue to have sex unless your health care provider directs you  otherwise. Relief of Pain or Discomfort  Wear a good support bra for breast tenderness.   Take warm sitz baths to soothe any pain or discomfort caused by hemorrhoids. Use hemorrhoid cream if your health care provider approves.   Rest with your legs elevated if you have leg cramps or low back pain.  If you develop varicose veins in your legs, wear support hose. Elevate your feet for 15 minutes, 3-4 times a day. Limit salt in your diet. Prenatal Care  Schedule your prenatal visits by the twelfth week of pregnancy. They are usually scheduled monthly at first, then more often in the last 2 months before delivery.  Write down your questions. Take them to your prenatal visits.  Keep all your prenatal visits as directed by your health care provider. Safety  Wear your seat belt at all times when driving.  Make a list of emergency phone numbers, including numbers for family, friends, the hospital, and police and fire departments. General   Tips  Ask your health care provider for a referral to a local prenatal education class. Begin classes no later than at the beginning of month 6 of your pregnancy.  Ask for help if you have counseling or nutritional needs during pregnancy. Your health care provider can offer advice or refer you to specialists for help with various needs.  Do not use hot tubs, steam rooms, or saunas.  Do not douche or use tampons or scented sanitary pads.  Do not cross your legs for long periods of time.  Avoid cat litter boxes and soil used by cats. These carry germs that can cause birth defects in the baby and possibly loss of the fetus by miscarriage or stillbirth.  Avoid all smoking, herbs, alcohol, and medicines not prescribed by your health care provider. Chemicals in these affect the formation and growth of the baby.  Schedule a dentist appointment. At home, brush your teeth with a soft toothbrush and be gentle when you floss. SEEK MEDICAL CARE IF:   You have  dizziness.  You have mild pelvic cramps, pelvic pressure, or nagging pain in the abdominal area.  You have persistent nausea, vomiting, or diarrhea.  You have a bad smelling vaginal discharge.  You have pain with urination.  You notice increased swelling in your face, hands, legs, or ankles. SEEK IMMEDIATE MEDICAL CARE IF:   You have a fever.  You are leaking fluid from your vagina.  You have spotting or bleeding from your vagina.  You have severe abdominal cramping or pain.  You have rapid weight gain or loss.  You vomit blood or material that looks like coffee grounds.  You are exposed to German measles and have never had them.  You are exposed to fifth disease or chickenpox.  You develop a severe headache.  You have shortness of breath.  You have any kind of trauma, such as from a fall or a car accident. Document Released: 09/02/2001 Document Revised: 01/23/2014 Document Reviewed: 07/19/2013 [employer] Patient Information 2015 [employer]. This information is not intended to replace advice given to you by your health care provider. Make sure you discuss any questions you have with your health care provider.   Nausea & Vomiting  Have saltine crackers or pretzels by your bed and eat a few bites before you raise your head out of bed in the morning  Eat small frequent meals throughout the day instead of large meals  Drink plenty of fluids throughout the day to stay hydrated, just don't drink a lot of fluids with your meals.  This can make your stomach fill up faster making you feel sick  Do not brush your teeth right after you eat  Products with real ginger are good for nausea, like ginger ale and ginger hard candy Make sure it says made with real ginger!  Sucking on sour candy like lemon heads is also good for nausea  If your prenatal vitamins make you nauseated, take them at night so you will sleep through the nausea  Sea Bands  If you feel like you need  medicine for the nausea & vomiting please let us know  If you are unable to keep any fluids or food down please let us know   Constipation  Drink plenty of fluid, preferably water, throughout the day  Eat foods high in fiber such as fruits, vegetables, and grains  Exercise, such as walking, is a good way to keep your bowels regular  Drink warm fluids, especially warm   prune juice, or decaf coffee  Eat a 1/2 cup of real oatmeal (not instant), 1/2 cup applesauce, and 1/2-1 cup warm prune juice every day  If needed, you may take Colace (docusate sodium) stool softener once or twice a day to help keep the stool soft. If you are pregnant, wait until you are out of your first trimester (12-14 weeks of pregnancy)  If you still are having problems with constipation, you may take Miralax once daily as needed to help keep your bowels regular.  If you are pregnant, wait until you are out of your first trimester (12-14 weeks of pregnancy)  Safe Medications in Pregnancy   Acne: Benzoyl Peroxide Salicylic Acid  Backache/Headache: Tylenol: 2 regular strength every 4 hours OR              2 Extra strength every 6 hours  Colds/Coughs/Allergies: Benadryl (alcohol free) 25 mg every 6 hours as needed Breath right strips Claritin Cepacol throat lozenges Chloraseptic throat spray Cold-Eeze- up to three times per day Cough drops, alcohol free Flonase (by prescription only) Guaifenesin Mucinex Robitussin DM (plain only, alcohol free) Saline nasal spray/drops Sudafed (pseudoephedrine) & Actifed ** use only after [redacted] weeks gestation and if you do not have high blood pressure Tylenol Vicks Vaporub Zinc lozenges Zyrtec   Constipation: Colace Ducolax suppositories Fleet enema Glycerin suppositories Metamucil Milk of magnesia Miralax Senokot Smooth move tea  Diarrhea: Kaopectate Imodium A-D  *NO pepto Bismol  Hemorrhoids: Anusol Anusol HC Preparation  H Tucks  Indigestion: Tums Maalox Mylanta Zantac  Pepcid  Insomnia: Benadryl (alcohol free) 25mg every 6 hours as needed Tylenol PM Unisom, no Gelcaps  Leg Cramps: Tums MagGel  Nausea/Vomiting:  Bonine Dramamine Emetrol Ginger extract Sea bands Meclizine  Nausea medication to take during pregnancy:  Unisom (doxylamine succinate 25 mg tablets) Take one tablet daily at bedtime. If symptoms are not adequately controlled, the dose can be increased to a maximum recommended dose of two tablets daily (1/2 tablet in the morning, 1/2 tablet mid-afternoon and one at bedtime). Vitamin B6 100mg tablets. Take one tablet twice a day (up to 200 mg per day).  Skin Rashes: Aveeno products Benadryl cream or 25mg every 6 hours as needed Calamine Lotion 1% cortisone cream  Yeast infection: Gyne-lotrimin 7 Monistat 7   **If taking multiple medications, please check labels to avoid duplicating the same active ingredients **take medication as directed on the label ** Do not exceed 4000 mg of tylenol in 24 hours **Do not take medications that contain aspirin or ibuprofen      

## 2017-10-20 NOTE — Progress Notes (Signed)
at Subjective:    Caroline HinesLashonda P Barnes is a G2P1001 4074w6d being seen today for her first obstetrical visit.  Her obstetrical history is significant for smoker and IUGR.  Pregnancy history fully reviewed.  Patient reports no complaints. Drinks >60oz soda a day, won't go to caffeine free. Also smokes.  Worried baby may be small again, Discussed smoking and IUGR and strongly recommended cessation/cutting down/ecigs.  Pt "will try". Also try to drink 1/2 a soda at a time instead of a whole one  Vitals:   10/20/17 1508  BP: (!) 110/56  Pulse: 88  Weight: 183 lb (83 kg)    HISTORY: OB History  Gravida Para Term Preterm AB Living  2 1 1  0 0 1  SAB TAB Ectopic Multiple Live Births  0 0 0 0 1    # Outcome Date GA Lbr Len/2nd Weight Sex Delivery Anes PTL Lv  2 Current           1 Term 01/29/17 2818w0d 00:18 4 lb 14 oz (2.21 kg) M Vag-Spont None N LIV     Birth Comments: WNL     Past Medical History:  Diagnosis Date  . Cellulitis    Past Surgical History:  Procedure Laterality Date  . NO PAST SURGERIES     Family History  Problem Relation Age of Onset  . Rashes / Skin problems Cousin        Contact dermatitis  . Cancer Cousin        breast  . Thyroid cancer Mother   . Diabetes Mother   . Cancer Mother        breast  . Diabetes Maternal Aunt   . Diabetes Maternal Uncle   . Diabetes Paternal Aunt   . Stroke Paternal Aunt   . Diabetes Paternal Uncle   . Cancer Paternal Grandmother      Exam              Pelvic Normal vagina, cx nonfriable.  Pap collected                       System:     Skin: normal coloration and turgor, no rashes    Neurologic: oriented, normal, normal mood   Extremities: normal strength, tone, and muscle mass   HEENT PERRLA   Mouth/Teeth mucous membranes moist, poor dentition   Neck supple and no masses   Cardiovascular: regular rate and rhythm   Respiratory:  appears well, vitals normal, no respiratory distress, acyanotic   Abdomen:  soft, non-tender;  FHR: 160 us        The [facility] with multiple MDs and other Advanced Practice Providers was explained to patient; also emphasized that residents, students are part of our team.  Assessment:    Pregnancy: G2P1001 Patient Active Problem List   Diagnosis Date Noted  . Supervision of normal pregnancy 10/20/2017  . Short interval between pregnancies affecting pregnancy, antepartum 10/20/2017  . Abnormal Pap smear of cervix 07/28/2016  . Smoker 07/28/2016  . Cellulitis of right leg 07/28/2015        Plan:     Initial labs drawn. Continue prenatal vitamins  Problem list reviewed and updated  Reviewed n/v relief measures and warning s/s to report  Reviewed recommended weight gain based on pre-gravid BMI  Encouraged well-balanced diet Genetic Screening discussed Integrated Screen: declined.  Ultrasound discussed; fetal survey: requested.  Return in about 4 weeks (around 11/17/2017)  for LROB.  CRESENZO-DISHMAN,Jayle Solarz 10/21/2017

## 2017-10-21 LAB — CBC
HEMATOCRIT: 39 % (ref 34.0–46.6)
Hemoglobin: 12.8 g/dL (ref 11.1–15.9)
MCH: 26.9 pg (ref 26.6–33.0)
MCHC: 32.8 g/dL (ref 31.5–35.7)
MCV: 82 fL (ref 79–97)
Platelets: 301 10*3/uL (ref 150–379)
RBC: 4.76 x10E6/uL (ref 3.77–5.28)
RDW: 14.6 % (ref 12.3–15.4)
WBC: 4.1 10*3/uL (ref 3.4–10.8)

## 2017-10-21 LAB — PMP SCREEN PROFILE (10S), URINE
Amphetamine Scrn, Ur: NEGATIVE ng/mL
BARBITURATE SCREEN URINE: NEGATIVE ng/mL
BENZODIAZEPINE SCREEN, URINE: NEGATIVE ng/mL
CANNABINOIDS UR QL SCN: NEGATIVE ng/mL
COCAINE(METAB.)SCREEN, URINE: NEGATIVE ng/mL
CREATININE(CRT), U: 105.1 mg/dL (ref 20.0–300.0)
Methadone Screen, Urine: NEGATIVE ng/mL
OPIATE SCREEN URINE: NEGATIVE ng/mL
OXYCODONE+OXYMORPHONE UR QL SCN: NEGATIVE ng/mL
Ph of Urine: 5.7 (ref 4.5–8.9)
Phencyclidine Qn, Ur: NEGATIVE ng/mL
Propoxyphene Scrn, Ur: NEGATIVE ng/mL

## 2017-10-21 LAB — HEPATITIS B SURFACE ANTIGEN: Hepatitis B Surface Ag: NEGATIVE

## 2017-10-21 LAB — URINALYSIS, ROUTINE W REFLEX MICROSCOPIC
BILIRUBIN UA: NEGATIVE
GLUCOSE, UA: NEGATIVE
KETONES UA: NEGATIVE
NITRITE UA: NEGATIVE
Protein, UA: NEGATIVE
RBC UA: NEGATIVE
SPEC GRAV UA: 1.015 (ref 1.005–1.030)
Urobilinogen, Ur: 0.2 mg/dL (ref 0.2–1.0)
pH, UA: 6 (ref 5.0–7.5)

## 2017-10-21 LAB — MICROSCOPIC EXAMINATION
Bacteria, UA: NONE SEEN
CASTS: NONE SEEN /LPF
RBC, UA: NONE SEEN /hpf (ref 0–?)

## 2017-10-21 LAB — HIV ANTIBODY (ROUTINE TESTING W REFLEX): HIV Screen 4th Generation wRfx: NONREACTIVE

## 2017-10-21 LAB — RPR: RPR: NONREACTIVE

## 2017-10-21 LAB — ANTIBODY SCREEN: Antibody Screen: NEGATIVE

## 2017-10-21 LAB — RUBELLA SCREEN: RUBELLA: 8.46 {index} (ref >0.99)

## 2017-10-21 MED ORDER — PRENATE PIXIE 10-0.6-0.4-200 MG PO CAPS: 1.0000 | ORAL_CAPSULE | Freq: Every day | ORAL | 11 refills | Status: DC

## 2017-10-22 LAB — CYTOLOGY - PAP
Chlamydia: NEGATIVE
DIAGNOSIS: NEGATIVE
HPV: NOT DETECTED
Neisseria Gonorrhea: NEGATIVE

## 2017-10-22 LAB — URINE CULTURE

## 2017-11-18 ENCOUNTER — Encounter: Payer: Self-pay | Admitting: Advanced Practice Midwife

## 2017-11-18 ENCOUNTER — Ambulatory Visit (INDEPENDENT_AMBULATORY_CARE_PROVIDER_SITE_OTHER): Payer: BLUE CROSS/BLUE SHIELD | Admitting: Advanced Practice Midwife

## 2017-11-18 VITALS — BP 120/70 | HR 98 | Wt 179.0 lb

## 2017-11-18 DIAGNOSIS — Z3A14 14 weeks gestation of pregnancy: Secondary | ICD-10-CM

## 2017-11-18 DIAGNOSIS — Z363 Encounter for antenatal screening for malformations: Secondary | ICD-10-CM

## 2017-11-18 DIAGNOSIS — Z331 Pregnant state, incidental: Secondary | ICD-10-CM

## 2017-11-18 DIAGNOSIS — Z3482 Encounter for supervision of other normal pregnancy, second trimester: Secondary | ICD-10-CM

## 2017-11-18 DIAGNOSIS — Z1389 Encounter for screening for other disorder: Secondary | ICD-10-CM

## 2017-11-18 LAB — POCT URINALYSIS DIPSTICK
GLUCOSE UA: NEGATIVE
LEUKOCYTES UA: NEGATIVE
NITRITE UA: NEGATIVE
RBC UA: NEGATIVE

## 2017-11-18 MED ORDER — ONDANSETRON 8 MG PO TBDP: 8.0000 mg | ORAL_TABLET | Freq: Three times a day (TID) | ORAL | 0 refills | Status: DC | PRN

## 2017-11-18 NOTE — Patient Instructions (Signed)
Caroline Barnes, I greatly value your feedback.  If you receive a survey following your visit with us today, we appreciate you taking the time to fill it out.  Thanks, Caroline Barnes, CNM     Second Trimester of Pregnancy The second trimester is from week 14 through week 27 (months 4 through 6). The second trimester is often a time when you feel your best. Your body has adjusted to being pregnant, and you begin to feel better physically. Usually, morning sickness has lessened or quit completely, you may have more energy, and you may have an increase in appetite. The second trimester is also a time when the fetus is growing rapidly. At the end of the sixth month, the fetus is about 9 inches long and weighs about 1 pounds. You will likely begin to feel the baby move (quickening) between 16 and 20 weeks of pregnancy. Body changes during your second trimester Your body continues to go through many changes during your second trimester. The changes vary from woman to woman.  Your weight will continue to increase. You will notice your lower abdomen bulging out.  You may begin to get stretch marks on your hips, abdomen, and breasts.  You may develop headaches that can be relieved by medicines. The medicines should be approved by your health care provider.  You may urinate more often because the fetus is pressing on your bladder.  You may develop or continue to have heartburn as a result of your pregnancy.  You may develop constipation because certain hormones are causing the muscles that push waste through your intestines to slow down.  You may develop hemorrhoids or swollen, bulging veins (varicose veins).  You may have back pain. This is caused by: ? Weight gain. ? Pregnancy hormones that are relaxing the joints in your pelvis. ? A shift in weight and the muscles that support your balance.  Your breasts will continue to grow and they will continue to become tender.  Your gums  may bleed and may be sensitive to brushing and flossing.  Dark spots or blotches (chloasma, mask of pregnancy) may develop on your face. This will likely fade after the baby is born.  A dark line from your belly button to the pubic area (linea nigra) may appear. This will likely fade after the baby is born.  You may have changes in your hair. These can include thickening of your hair, rapid growth, and changes in texture. Some women also have hair loss during or after pregnancy, or hair that feels dry or thin. Your hair will most likely return to normal after your baby is born.  What to expect at prenatal visits During a routine prenatal visit:  You will be weighed to make sure you and the fetus are growing normally.  Your blood pressure will be taken.  Your abdomen will be measured to track your baby's growth.  The fetal heartbeat will be listened to.  Any test results from the previous visit will be discussed.  Your health care provider may ask you:  How you are feeling.  If you are feeling the baby move.  If you have had any abnormal symptoms, such as leaking fluid, bleeding, severe headaches, or abdominal cramping.  If you are using any tobacco products, including cigarettes, chewing tobacco, and electronic cigarettes.  If you have any questions.  Other tests that may be performed during your second trimester include:  Blood tests that check for: ? Low iron levels (anemia). ?  High blood sugar that affects pregnant women (gestational diabetes) between 20 and 28 weeks. ? Rh antibodies. This is to check for a protein on red blood cells (Rh factor).  Urine tests to check for infections, diabetes, or protein in the urine.  An ultrasound to confirm the proper growth and development of the baby.  An amniocentesis to check for possible genetic problems.  Fetal screens for spina bifida and Down syndrome.  HIV (human immunodeficiency virus) testing. Routine prenatal testing  includes screening for HIV, unless you choose not to have this test.  Follow these instructions at home: Medicines  Follow your health care provider's instructions regarding medicine use. Specific medicines may be either safe or unsafe to take during pregnancy.  Take a prenatal vitamin that contains at least 600 micrograms (mcg) of folic acid.  If you develop constipation, try taking a stool softener if your health care provider approves. Eating and drinking  Eat a balanced diet that includes fresh fruits and vegetables, whole grains, good sources of protein such as meat, eggs, or tofu, and low-fat dairy. Your health care provider will help you determine the amount of weight gain that is right for you.  Avoid raw meat and uncooked cheese. These carry germs that can cause birth defects in the baby.  If you have low calcium intake from food, talk to your health care provider about whether you should take a daily calcium supplement.  Limit foods that are high in fat and processed sugars, such as fried and sweet foods.  To prevent constipation: ? Drink enough fluid to keep your urine clear or pale yellow. ? Eat foods that are high in fiber, such as fresh fruits and vegetables, whole grains, and beans. Activity  Exercise only as directed by your health care provider. Most women can continue their usual exercise routine during pregnancy. Try to exercise for 30 minutes at least 5 days a week. Stop exercising if you experience uterine contractions.  Avoid heavy lifting, wear low heel shoes, and practice good posture.  A sexual relationship may be continued unless your health care provider directs you otherwise. Relieving pain and discomfort  Wear a good support bra to prevent discomfort from breast tenderness.  Take warm sitz baths to soothe any pain or discomfort caused by hemorrhoids. Use hemorrhoid cream if your health care provider approves.  Rest with your legs elevated if you have  leg cramps or low back pain.  If you develop varicose veins, wear support hose. Elevate your feet for 15 minutes, 3-4 times a day. Limit salt in your diet. Prenatal Care  Write down your questions. Take them to your prenatal visits.  Keep all your prenatal visits as told by your health care provider. This is important. Safety  Wear your seat belt at all times when driving.  Make a list of emergency phone numbers, including numbers for family, friends, the hospital, and police and fire departments. General instructions  Ask your health care provider for a referral to a local prenatal education class. Begin classes no later than the beginning of month 6 of your pregnancy.  Ask for help if you have counseling or nutritional needs during pregnancy. Your health care provider can offer advice or refer you to specialists for help with various needs.  Do not use hot tubs, steam rooms, or saunas.  Do not douche or use tampons or scented sanitary pads.  Do not cross your legs for long periods of time.  Avoid cat litter boxes and  soil used by cats. These carry germs that can cause birth defects in the baby and possibly loss of the fetus by miscarriage or stillbirth.  Avoid all smoking, herbs, alcohol, and unprescribed drugs. Chemicals in these products can affect the formation and growth of the baby.  Do not use any products that contain nicotine or tobacco, such as cigarettes and e-cigarettes. If you need help quitting, ask your health care provider.  Visit your dentist if you have not gone yet during your pregnancy. Use a soft toothbrush to brush your teeth and be gentle when you floss. Contact a health care provider if:  You have dizziness.  You have mild pelvic cramps, pelvic pressure, or nagging pain in the abdominal area.  You have persistent nausea, vomiting, or diarrhea.  You have a bad smelling vaginal discharge.  You have pain when you urinate. Get help right away if:  You  have a fever.  You are leaking fluid from your vagina.  You have spotting or bleeding from your vagina.  You have severe abdominal cramping or pain.  You have rapid weight gain or weight loss.  You have shortness of breath with chest pain.  You notice sudden or extreme swelling of your face, hands, ankles, feet, or legs.  You have not felt your baby move in over an hour.  You have severe headaches that do not go away when you take medicine.  You have vision changes. Summary  The second trimester is from week 14 through week 27 (months 4 through 6). It is also a time when the fetus is growing rapidly.  Your body goes through many changes during pregnancy. The changes vary from woman to woman.  Avoid all smoking, herbs, alcohol, and unprescribed drugs. These chemicals affect the formation and growth your baby.  Do not use any tobacco products, such as cigarettes, chewing tobacco, and e-cigarettes. If you need help quitting, ask your health care provider.  Contact your health care provider if you have any questions. Keep all prenatal visits as told by your health care provider. This is important. This information is not intended to replace advice given to you by your health care provider. Make sure you discuss any questions you have with your health care provider.      CHILDBIRTH CLASSES [phone] is the phone number for Pregnancy Classes or hospital tours at [place] will be referred to  [email or web address] for more information on childbirth classes  At this site you may register for classes. You may sign up for a waiting list if classes are full. Please SIGN UP FOR THIS!.   When the waiting list becomes long, sometimes new classes can be added.

## 2017-11-18 NOTE — Progress Notes (Signed)
  G2P1001 145w0d Estimated Date of Delivery: 05/19/18  Blood pressure 120/70, pulse 98, weight 179 lb (81.2 kg), last menstrual period 08/12/2017, not currently breastfeeding.   BP weight and urine results all reviewed and noted.  Please refer to the obstetrical flow sheet for the fundal height and fetal heart rate documentation:  Patient denies any bleeding and no rupture of membranes symptoms or regular contractions. Patient is without complaints. All questions were answered.   Physical Assessment:   Vitals:   11/18/17 1620  BP: 120/70  Pulse: 98  Weight: 179 lb (81.2 kg)  Body mass index is 28.89 kg/m.        Physical Examination:   General appearance: Well appearing, and in no distress  Mental status: Alert, oriented to person, place, and time  Skin: Warm & dry  Cardiovascular: Normal heart rate noted  Respiratory: Normal respiratory effort, no distress  Abdomen: Soft, gravid, nontender  Pelvic: Cervical exam deferred         Extremities: Edema: None  Fetal Status:          Results for orders placed or performed in visit on 11/18/17 (from the past 24 hour(s))  POCT urinalysis dipstick   Collection Time: 11/18/17  4:22 PM  Result Value Ref Range   Color, UA     Clarity, UA     Glucose, UA neg    Bilirubin, UA     Ketones, UA small    Spec Grav, UA  1.010 - 1.025   Blood, UA neg    pH, UA  5.0 - 8.0   Protein, UA trace    Urobilinogen, UA  0.2 or 1.0 E.U./dL   Nitrite, UA neg    Leukocytes, UA Negative Negative   Appearance     Odor       Orders Placed This Encounter  Procedures  . US OB Comp + 14 Wk  . POCT urinalysis dipstick    Plan:  Continued routine obstetrical care,   Return in about 4 weeks (around 12/16/2017) for LROB, ZO:XWRUEAVS:Anatomy.

## 2017-12-10 ENCOUNTER — Ambulatory Visit: Payer: BLUE CROSS/BLUE SHIELD

## 2017-12-10 ENCOUNTER — Ambulatory Visit (INDEPENDENT_AMBULATORY_CARE_PROVIDER_SITE_OTHER): Payer: BLUE CROSS/BLUE SHIELD | Admitting: Women's Health

## 2017-12-10 ENCOUNTER — Encounter: Payer: Self-pay | Admitting: Women's Health

## 2017-12-10 VITALS — BP 116/64 | HR 92 | Wt 177.0 lb

## 2017-12-10 DIAGNOSIS — Z331 Pregnant state, incidental: Secondary | ICD-10-CM

## 2017-12-10 DIAGNOSIS — O219 Vomiting of pregnancy, unspecified: Secondary | ICD-10-CM

## 2017-12-10 DIAGNOSIS — Z3482 Encounter for supervision of other normal pregnancy, second trimester: Secondary | ICD-10-CM

## 2017-12-10 DIAGNOSIS — Z1389 Encounter for screening for other disorder: Secondary | ICD-10-CM

## 2017-12-10 DIAGNOSIS — Z3A17 17 weeks gestation of pregnancy: Secondary | ICD-10-CM

## 2017-12-10 DIAGNOSIS — O09292 Supervision of pregnancy with other poor reproductive or obstetric history, second trimester: Secondary | ICD-10-CM

## 2017-12-10 MED ORDER — ONDANSETRON 8 MG PO TBDP: 8.0000 mg | ORAL_TABLET | Freq: Three times a day (TID) | ORAL | 0 refills | Status: DC | PRN

## 2017-12-10 NOTE — Patient Instructions (Signed)
Caroline Barnes, I greatly value your feedback.  If you receive a survey following your visit with us today, we appreciate you taking the time to fill it out.  Thanks, [name], CNM, WHNP-BC   Second Trimester of Pregnancy The second trimester is from week 14 through week 27 (months 4 through 6). The second trimester is often a time when you feel your best. Your body has adjusted to being pregnant, and you begin to feel better physically. Usually, morning sickness has lessened or quit completely, you may have more energy, and you may have an increase in appetite. The second trimester is also a time when the fetus is growing rapidly. At the end of the sixth month, the fetus is about 9 inches long and weighs about 1 pounds. You will likely begin to feel the baby move (quickening) between 16 and 20 weeks of pregnancy. Body changes during your second trimester Your body continues to go through many changes during your second trimester. The changes vary from woman to woman.  Your weight will continue to increase. You will notice your lower abdomen bulging out.  You may begin to get stretch marks on your hips, abdomen, and breasts.  You may develop headaches that can be relieved by medicines. The medicines should be approved by your health care provider.  You may urinate more often because the fetus is pressing on your bladder.  You may develop or continue to have heartburn as a result of your pregnancy.  You may develop constipation because certain hormones are causing the muscles that push waste through your intestines to slow down.  You may develop hemorrhoids or swollen, bulging veins (varicose veins).  You may have back pain. This is caused by: ? Weight gain. ? Pregnancy hormones that are relaxing the joints in your pelvis. ? A shift in weight and the muscles that support your balance.  Your breasts will continue to grow and they will continue to become tender.  Your gums may  bleed and may be sensitive to brushing and flossing.  Dark spots or blotches (chloasma, mask of pregnancy) may develop on your face. This will likely fade after the baby is born.  A dark line from your belly button to the pubic area (linea nigra) may appear. This will likely fade after the baby is born.  You may have changes in your hair. These can include thickening of your hair, rapid growth, and changes in texture. Some women also have hair loss during or after pregnancy, or hair that feels dry or thin. Your hair will most likely return to normal after your baby is born.  What to expect at prenatal visits During a routine prenatal visit:  You will be weighed to make sure you and the fetus are growing normally.  Your blood pressure will be taken.  Your abdomen will be measured to track your baby's growth.  The fetal heartbeat will be listened to.  Any test results from the previous visit will be discussed.  Your health care provider may ask you:  How you are feeling.  If you are feeling the baby move.  If you have had any abnormal symptoms, such as leaking fluid, bleeding, severe headaches, or abdominal cramping.  If you are using any tobacco products, including cigarettes, chewing tobacco, and electronic cigarettes.  If you have any questions.  Other tests that may be performed during your second trimester include:  Blood tests that check for: ? Low iron levels (anemia). ? High  blood sugar that affects pregnant women (gestational diabetes) between 42 and 28 weeks. ? Rh antibodies. This is to check for a protein on red blood cells (Rh factor).  Urine tests to check for infections, diabetes, or protein in the urine.  An ultrasound to confirm the proper growth and development of the baby.  An amniocentesis to check for possible genetic problems.  Fetal screens for spina bifida and Down syndrome.  HIV (human immunodeficiency virus) testing. Routine prenatal testing  includes screening for HIV, unless you choose not to have this test.  Follow these instructions at home: Medicines  Follow your health care provider's instructions regarding medicine use. Specific medicines may be either safe or unsafe to take during pregnancy.  Take a prenatal vitamin that contains at least 600 micrograms (mcg) of folic acid.  If you develop constipation, try taking a stool softener if your health care provider approves. Eating and drinking  Eat a balanced diet that includes fresh fruits and vegetables, whole grains, good sources of protein such as meat, eggs, or tofu, and low-fat dairy. Your health care provider will help you determine the amount of weight gain that is right for you.  Avoid raw meat and uncooked cheese. These carry germs that can cause birth defects in the baby.  If you have low calcium intake from food, talk to your health care provider about whether you should take a daily calcium supplement.  Limit foods that are high in fat and processed sugars, such as fried and sweet foods.  To prevent constipation: ? Drink enough fluid to keep your urine clear or pale yellow. ? Eat foods that are high in fiber, such as fresh fruits and vegetables, whole grains, and beans. Activity  Exercise only as directed by your health care provider. Most women can continue their usual exercise routine during pregnancy. Try to exercise for 30 minutes at least 5 days a week. Stop exercising if you experience uterine contractions.  Avoid heavy lifting, wear low heel shoes, and practice good posture.  A sexual relationship may be continued unless your health care provider directs you otherwise. Relieving pain and discomfort  Wear a good support bra to prevent discomfort from breast tenderness.  Take warm sitz baths to soothe any pain or discomfort caused by hemorrhoids. Use hemorrhoid cream if your health care provider approves.  Rest with your legs elevated if you have  leg cramps or low back pain.  If you develop varicose veins, wear support hose. Elevate your feet for 15 minutes, 3-4 times a day. Limit salt in your diet. Prenatal Care  Write down your questions. Take them to your prenatal visits.  Keep all your prenatal visits as told by your health care provider. This is important. Safety  Wear your seat belt at all times when driving.  Make a list of emergency phone numbers, including numbers for family, friends, the hospital, and police and fire departments. General instructions  Ask your health care provider for a referral to a local prenatal education class. Begin classes no later than the beginning of month 6 of your pregnancy.  Ask for help if you have counseling or nutritional needs during pregnancy. Your health care provider can offer advice or refer you to specialists for help with various needs.  Do not use hot tubs, steam rooms, or saunas.  Do not douche or use tampons or scented sanitary pads.  Do not cross your legs for long periods of time.  Avoid cat litter boxes and soil  used by cats. These carry germs that can cause birth defects in the baby and possibly loss of the fetus by miscarriage or stillbirth.  Avoid all smoking, herbs, alcohol, and unprescribed drugs. Chemicals in these products can affect the formation and growth of the baby.  Do not use any products that contain nicotine or tobacco, such as cigarettes and e-cigarettes. If you need help quitting, ask your health care provider.  Visit your dentist if you have not gone yet during your pregnancy. Use a soft toothbrush to brush your teeth and be gentle when you floss. Contact a health care provider if:  You have dizziness.  You have mild pelvic cramps, pelvic pressure, or nagging pain in the abdominal area.  You have persistent nausea, vomiting, or diarrhea.  You have a bad smelling vaginal discharge.  You have pain when you urinate. Get help right away if:  You  have a fever.  You are leaking fluid from your vagina.  You have spotting or bleeding from your vagina.  You have severe abdominal cramping or pain.  You have rapid weight gain or weight loss.  You have shortness of breath with chest pain.  You notice sudden or extreme swelling of your face, hands, ankles, feet, or legs.  You have not felt your baby move in over an hour.  You have severe headaches that do not go away when you take medicine.  You have vision changes. Summary  The second trimester is from week 14 through week 27 (months 4 through 6). It is also a time when the fetus is growing rapidly.  Your body goes through many changes during pregnancy. The changes vary from woman to woman.  Avoid all smoking, herbs, alcohol, and unprescribed drugs. These chemicals affect the formation and growth your baby.  Do not use any tobacco products, such as cigarettes, chewing tobacco, and e-cigarettes. If you need help quitting, ask your health care provider.  Contact your health care provider if you have any questions. Keep all prenatal visits as told by your health care provider. This is important. This information is not intended to replace advice given to you by your health care provider. Make sure you discuss any questions you have with your health care provider. Document Released: 09/02/2001 Document Revised: 02/14/2016 Document Reviewed: 11/09/2012 Elsevier Interactive Patient Education  2017 [employer].

## 2017-12-10 NOTE — Progress Notes (Signed)
   LOW-RISK PREGNANCY VISIT Patient name: Caroline Barnes MRN [id number]  Date of birth: 07/10/1981 Chief Complaint:   Routine Prenatal Visit (US today)  History of Present Illness:   Caroline HinesLashonda P Holderman is a 37 y.o. 1012P1001 female at 9584w1d with an Estimated Date of Delivery: 05/19/18 being seen today for ongoing management of a low-risk pregnancy.  Today she reports no complaints. Requests refill on zofran.   . Vag. Bleeding: None.  Movement: Absent. denies leaking of fluid. Review of Systems:   Pertinent items are noted in HPI Denies abnormal vaginal discharge w/ itching/odor/irritation, headaches, visual changes, shortness of breath, chest pain, abdominal pain, severe nausea/vomiting, or problems with urination or bowel movements unless otherwise stated above. Pertinent History Reviewed:  Reviewed past medical,surgical, social, obstetrical and family history.  Reviewed problem list, medications and allergies. Physical Assessment:   Vitals:   12/10/17 1528  BP: 116/64  Pulse: 92  Weight: 177 lb (80.3 kg)  Body mass index is 28.57 kg/m.        Physical Examination:   General appearance: Well appearing, and in no distress  Mental status: Alert, oriented to person, place, and time  Skin: Warm & dry  Cardiovascular: Normal heart rate noted  Respiratory: Normal respiratory effort, no distress  Abdomen: Soft, gravid, nontender  Pelvic: Cervical exam deferred         Extremities: Edema: None  Fetal Status: Fetal Heart Rate (bpm): 133 u/s   Movement: Absent    Was scheduled for anatomy, however she is 1wk too early, Amber did quick informal u/s for gender- looks like female  No results found for this or any previous visit (from the past 24 hour(s)).  Assessment & Plan:  1) Low-risk pregnancy G2P1001 at 6484w1d with an Estimated Date of Delivery: 05/19/18   2) N/V, refilled zofran  3) H/O IUGR> will do serial growth u/s   Meds:  Meds ordered this encounter  Medications  .  ondansetron (ZOFRAN-ODT) 8 MG disintegrating tablet    Sig: Take 1 tablet (8 mg total) by mouth every 8 (eight) hours as needed for nausea or vomiting.    Dispense:  20 tablet    Refill:  0    Order Specific Question:   Supervising Provider    Answer:   Lazaro ArmsEURE, LUTHER H [[id number]]   Labs/procedures today: none  Plan:  Continue routine obstetrical care   Reviewed: Preterm labor symptoms and general obstetric precautions including but not limited to vaginal bleeding, contractions, leaking of fluid and fetal movement were reviewed in detail with the patient.  All questions were answered  Follow-up: Return in about 1 week (around 12/17/2017) for WU:[id number]:Anatomy (no visit), then 3wks for LROB.  Orders Placed This Encounter  Procedures  . POCT Urinalysis Dipstick   Cheral MarkerKimberly R Jeanae Whitmill CNM, [facility] HospitalWHNP-BC 12/10/2017 4:50 PM

## 2017-12-16 ENCOUNTER — Other Ambulatory Visit: Payer: BLUE CROSS/BLUE SHIELD

## 2017-12-16 NOTE — Addendum Note (Signed)
Addended by: Moss McRESENZO, LATISHA M on: 12/16/2017 09:28 AM   Modules accepted: Orders

## 2017-12-17 ENCOUNTER — Other Ambulatory Visit: Payer: BLUE CROSS/BLUE SHIELD

## 2017-12-31 ENCOUNTER — Ambulatory Visit (INDEPENDENT_AMBULATORY_CARE_PROVIDER_SITE_OTHER): Payer: BLUE CROSS/BLUE SHIELD | Admitting: Women's Health

## 2017-12-31 ENCOUNTER — Encounter: Payer: Self-pay | Admitting: Women's Health

## 2017-12-31 ENCOUNTER — Ambulatory Visit (INDEPENDENT_AMBULATORY_CARE_PROVIDER_SITE_OTHER): Payer: BLUE CROSS/BLUE SHIELD

## 2017-12-31 VITALS — BP 88/60 | HR 71 | Wt 179.0 lb

## 2017-12-31 DIAGNOSIS — O09292 Supervision of pregnancy with other poor reproductive or obstetric history, second trimester: Secondary | ICD-10-CM

## 2017-12-31 DIAGNOSIS — Z3402 Encounter for supervision of normal first pregnancy, second trimester: Secondary | ICD-10-CM

## 2017-12-31 DIAGNOSIS — Z3A20 20 weeks gestation of pregnancy: Secondary | ICD-10-CM

## 2017-12-31 DIAGNOSIS — Z3482 Encounter for supervision of other normal pregnancy, second trimester: Secondary | ICD-10-CM

## 2017-12-31 DIAGNOSIS — Z363 Encounter for antenatal screening for malformations: Secondary | ICD-10-CM

## 2017-12-31 DIAGNOSIS — Z1389 Encounter for screening for other disorder: Secondary | ICD-10-CM

## 2017-12-31 DIAGNOSIS — Z331 Pregnant state, incidental: Secondary | ICD-10-CM

## 2017-12-31 DIAGNOSIS — O219 Vomiting of pregnancy, unspecified: Secondary | ICD-10-CM

## 2017-12-31 LAB — POCT URINALYSIS DIPSTICK
Blood, UA: NEGATIVE
Glucose, UA: NEGATIVE
KETONES UA: NEGATIVE
Leukocytes, UA: NEGATIVE
Nitrite, UA: NEGATIVE
PROTEIN UA: NEGATIVE

## 2017-12-31 MED ORDER — ONDANSETRON 8 MG PO TBDP: 8.0000 mg | ORAL_TABLET | Freq: Three times a day (TID) | ORAL | 0 refills | Status: DC | PRN

## 2017-12-31 NOTE — Progress Notes (Signed)
LOW-RISK PREGNANCY VISIT Patient name: Caroline Barnes MRN [id number]  Date of birth: Jun 20, 1981 Chief Complaint:   Routine Prenatal Visit (anatomy scan)  History of Present Illness:   Caroline Barnes is a 37 y.o. G62P1001 female at [redacted]w[redacted]d with an Estimated Date of Delivery: 05/19/18 being seen today for ongoing management of a low-risk pregnancy.  Today she reports needs note to be able to eat frequently at work, gets n/v if doesn't. Requests refill on zofran. Contractions: Not present. Vag. Bleeding: None.  Movement: Absent. denies leaking of fluid. Review of Systems:   Pertinent items are noted in HPI Denies abnormal vaginal discharge w/ itching/odor/irritation, headaches, visual changes, shortness of breath, chest pain, abdominal pain, severe nausea/vomiting, or problems with urination or bowel movements unless otherwise stated above. Pertinent History Reviewed:  Reviewed past medical,surgical, social, obstetrical and family history.  Reviewed problem list, medications and allergies. Physical Assessment:   Vitals:   12/31/17 1612  BP: (!) 88/60  Pulse: 71  Weight: 179 lb (81.2 kg)  Body mass index is 28.89 kg/m.        Physical Examination:   General appearance: Well appearing, and in no distress  Mental status: Alert, oriented to person, place, and time  Skin: Warm & dry  Cardiovascular: Normal heart rate noted  Respiratory: Normal respiratory effort, no distress  Abdomen: Soft, gravid, nontender  Pelvic: Cervical exam deferred         Extremities: Edema: None  Fetal Status: Fetal Heart Rate (bpm): 146 u/s   Movement: Absent   [place] 20+1 wks,breech,anterior pl gr 0,normal ovaries bilat,svp of fluid 6.6 cm,fhr 146 bpm,cx 3.4 cm,EFW 341 g 50%,anatomy complete,no obvious abnormalities   Results for orders placed or performed in visit on 12/31/17 (from the past 24 hour(s))  POCT urinalysis dipstick   Collection Time: 12/31/17  4:16 PM  Result Value Ref Range   Color,  UA     Clarity, UA     Glucose, UA neg    Bilirubin, UA     Ketones, UA neg    Spec Grav, UA  1.010 - 1.025   Blood, UA neg    pH, UA  5.0 - 8.0   Protein, UA neg    Urobilinogen, UA  0.2 or 1.0 E.U./dL   Nitrite, UA neg    Leukocytes, UA Negative Negative   Appearance     Odor      Assessment & Plan:  1) Low-risk pregnancy G2P1001 at [redacted]w[redacted]d with an Estimated Date of Delivery: 05/19/18   2) N/V, refilled zofran, not given to be able to eat frequently at work  3) H/O IUGR> efw 50% today, will repeat at 63, 32, 36wks   Meds:  Meds ordered this encounter  Medications  . ondansetron (ZOFRAN-ODT) 8 MG disintegrating tablet    Sig: Take 1 tablet (8 mg total) by mouth every 8 (eight) hours as needed for nausea or vomiting.    Dispense:  20 tablet    Refill:  0    Order Specific Question:   Supervising Provider    Answer:   Lazaro Arms [[id number]]   Labs/procedures today: anatomy u/s  Plan:  Continue routine obstetrical care   Reviewed: Preterm labor symptoms and general obstetric precautions including but not limited to vaginal bleeding, contractions, leaking of fluid and fetal movement were reviewed in detail with the patient.  All questions were answered  Follow-up: Return in about 1 month (around 01/28/2018) for LROB.  Orders Placed This Encounter  Procedures  . POCT urinalysis dipstick   Cheral MarkerKimberly R Lilah Mijangos CNM, [facility]WHNP-BC 12/31/2017 4:36 PM

## 2017-12-31 NOTE — Progress Notes (Signed)
US 20+1 wks,breech,anterior pl gr 0,normal ovaries bilat,svp of fluid 6.6 cm,fhr 146 bpm,cx 3.4 cm,EFW 341 g 50%,anatomy complete,no obvious abnormalities

## 2017-12-31 NOTE — Patient Instructions (Signed)
Caroline Barnes, I greatly value your feedback.  If you receive a survey following your visit with us today, we appreciate you taking the time to fill it out.  Thanks, Kim Linell Shawn, CNM, WHNP-BC   Second Trimester of Pregnancy The second trimester is from week 14 through week 27 (months 4 through 6). The second trimester is often a time when you feel your best. Your body has adjusted to being pregnant, and you begin to feel better physically. Usually, morning sickness has lessened or quit completely, you may have more energy, and you may have an increase in appetite. The second trimester is also a time when the fetus is growing rapidly. At the end of the sixth month, the fetus is about 9 inches long and weighs about 1 pounds. You will likely begin to feel the baby move (quickening) between 16 and 20 weeks of pregnancy. Body changes during your second trimester Your body continues to go through many changes during your second trimester. The changes vary from woman to woman.  Your weight will continue to increase. You will notice your lower abdomen bulging out.  You may begin to get stretch marks on your hips, abdomen, and breasts.  You may develop headaches that can be relieved by medicines. The medicines should be approved by your health care provider.  You may urinate more often because the fetus is pressing on your bladder.  You may develop or continue to have heartburn as a result of your pregnancy.  You may develop constipation because certain hormones are causing the muscles that push waste through your intestines to slow down.  You may develop hemorrhoids or swollen, bulging veins (varicose veins).  You may have back pain. This is caused by: ? Weight gain. ? Pregnancy hormones that are relaxing the joints in your pelvis. ? A shift in weight and the muscles that support your balance.  Your breasts will continue to grow and they will continue to become tender.  Your gums may  bleed and may be sensitive to brushing and flossing.  Dark spots or blotches (chloasma, mask of pregnancy) may develop on your face. This will likely fade after the baby is born.  A dark line from your belly button to the pubic area (linea nigra) may appear. This will likely fade after the baby is born.  You may have changes in your hair. These can include thickening of your hair, rapid growth, and changes in texture. Some women also have hair loss during or after pregnancy, or hair that feels dry or thin. Your hair will most likely return to normal after your baby is born.  What to expect at prenatal visits During a routine prenatal visit:  You will be weighed to make sure you and the fetus are growing normally.  Your blood pressure will be taken.  Your abdomen will be measured to track your baby's growth.  The fetal heartbeat will be listened to.  Any test results from the previous visit will be discussed.  Your health care provider may ask you:  How you are feeling.  If you are feeling the baby move.  If you have had any abnormal symptoms, such as leaking fluid, bleeding, severe headaches, or abdominal cramping.  If you are using any tobacco products, including cigarettes, chewing tobacco, and electronic cigarettes.  If you have any questions.  Other tests that may be performed during your second trimester include:  Blood tests that check for: ? Low iron levels (anemia). ? High   blood sugar that affects pregnant women (gestational diabetes) between 42 and 28 weeks. ? Rh antibodies. This is to check for a protein on red blood cells (Rh factor).  Urine tests to check for infections, diabetes, or protein in the urine.  An ultrasound to confirm the proper growth and development of the baby.  An amniocentesis to check for possible genetic problems.  Fetal screens for spina bifida and Down syndrome.  HIV (human immunodeficiency virus) testing. Routine prenatal testing  includes screening for HIV, unless you choose not to have this test.  Follow these instructions at home: Medicines  Follow your health care provider's instructions regarding medicine use. Specific medicines may be either safe or unsafe to take during pregnancy.  Take a prenatal vitamin that contains at least 600 micrograms (mcg) of folic acid.  If you develop constipation, try taking a stool softener if your health care provider approves. Eating and drinking  Eat a balanced diet that includes fresh fruits and vegetables, whole grains, good sources of protein such as meat, eggs, or tofu, and low-fat dairy. Your health care provider will help you determine the amount of weight gain that is right for you.  Avoid raw meat and uncooked cheese. These carry germs that can cause birth defects in the baby.  If you have low calcium intake from food, talk to your health care provider about whether you should take a daily calcium supplement.  Limit foods that are high in fat and processed sugars, such as fried and sweet foods.  To prevent constipation: ? Drink enough fluid to keep your urine clear or pale yellow. ? Eat foods that are high in fiber, such as fresh fruits and vegetables, whole grains, and beans. Activity  Exercise only as directed by your health care provider. Most women can continue their usual exercise routine during pregnancy. Try to exercise for 30 minutes at least 5 days a week. Stop exercising if you experience uterine contractions.  Avoid heavy lifting, wear low heel shoes, and practice good posture.  A sexual relationship may be continued unless your health care provider directs you otherwise. Relieving pain and discomfort  Wear a good support bra to prevent discomfort from breast tenderness.  Take warm sitz baths to soothe any pain or discomfort caused by hemorrhoids. Use hemorrhoid cream if your health care provider approves.  Rest with your legs elevated if you have  leg cramps or low back pain.  If you develop varicose veins, wear support hose. Elevate your feet for 15 minutes, 3-4 times a day. Limit salt in your diet. Prenatal Care  Write down your questions. Take them to your prenatal visits.  Keep all your prenatal visits as told by your health care provider. This is important. Safety  Wear your seat belt at all times when driving.  Make a list of emergency phone numbers, including numbers for family, friends, the hospital, and police and fire departments. General instructions  Ask your health care provider for a referral to a local prenatal education class. Begin classes no later than the beginning of month 6 of your pregnancy.  Ask for help if you have counseling or nutritional needs during pregnancy. Your health care provider can offer advice or refer you to specialists for help with various needs.  Do not use hot tubs, steam rooms, or saunas.  Do not douche or use tampons or scented sanitary pads.  Do not cross your legs for long periods of time.  Avoid cat litter boxes and soil  used by cats. These carry germs that can cause birth defects in the baby and possibly loss of the fetus by miscarriage or stillbirth.  Avoid all smoking, herbs, alcohol, and unprescribed drugs. Chemicals in these products can affect the formation and growth of the baby.  Do not use any products that contain nicotine or tobacco, such as cigarettes and e-cigarettes. If you need help quitting, ask your health care provider.  Visit your dentist if you have not gone yet during your pregnancy. Use a soft toothbrush to brush your teeth and be gentle when you floss. Contact a health care provider if:  You have dizziness.  You have mild pelvic cramps, pelvic pressure, or nagging pain in the abdominal area.  You have persistent nausea, vomiting, or diarrhea.  You have a bad smelling vaginal discharge.  You have pain when you urinate. Get help right away if:  You  have a fever.  You are leaking fluid from your vagina.  You have spotting or bleeding from your vagina.  You have severe abdominal cramping or pain.  You have rapid weight gain or weight loss.  You have shortness of breath with chest pain.  You notice sudden or extreme swelling of your face, hands, ankles, feet, or legs.  You have not felt your baby move in over an hour.  You have severe headaches that do not go away when you take medicine.  You have vision changes. Summary  The second trimester is from week 14 through week 27 (months 4 through 6). It is also a time when the fetus is growing rapidly.  Your body goes through many changes during pregnancy. The changes vary from woman to woman.  Avoid all smoking, herbs, alcohol, and unprescribed drugs. These chemicals affect the formation and growth your baby.  Do not use any tobacco products, such as cigarettes, chewing tobacco, and e-cigarettes. If you need help quitting, ask your health care provider.  Contact your health care provider if you have any questions. Keep all prenatal visits as told by your health care provider. This is important. This information is not intended to replace advice given to you by your health care provider. Make sure you discuss any questions you have with your health care provider. Document Released: 09/02/2001 Document Revised: 02/14/2016 Document Reviewed: 11/09/2012 Elsevier Interactive Patient Education  2017 [employer].

## 2018-01-28 ENCOUNTER — Ambulatory Visit (INDEPENDENT_AMBULATORY_CARE_PROVIDER_SITE_OTHER): Payer: BLUE CROSS/BLUE SHIELD | Admitting: Women's Health

## 2018-01-28 ENCOUNTER — Encounter: Payer: Self-pay | Admitting: Women's Health

## 2018-01-28 VITALS — BP 102/60 | HR 80 | Wt 177.5 lb

## 2018-01-28 DIAGNOSIS — O99612 Diseases of the digestive system complicating pregnancy, second trimester: Secondary | ICD-10-CM

## 2018-01-28 DIAGNOSIS — Z3A24 24 weeks gestation of pregnancy: Secondary | ICD-10-CM

## 2018-01-28 DIAGNOSIS — O09292 Supervision of pregnancy with other poor reproductive or obstetric history, second trimester: Secondary | ICD-10-CM

## 2018-01-28 DIAGNOSIS — Z8759 Personal history of other complications of pregnancy, childbirth and the puerperium: Secondary | ICD-10-CM

## 2018-01-28 DIAGNOSIS — O219 Vomiting of pregnancy, unspecified: Secondary | ICD-10-CM

## 2018-01-28 DIAGNOSIS — Z1389 Encounter for screening for other disorder: Secondary | ICD-10-CM

## 2018-01-28 DIAGNOSIS — Z331 Pregnant state, incidental: Secondary | ICD-10-CM

## 2018-01-28 DIAGNOSIS — Z3482 Encounter for supervision of other normal pregnancy, second trimester: Secondary | ICD-10-CM

## 2018-01-28 DIAGNOSIS — K5900 Constipation, unspecified: Secondary | ICD-10-CM

## 2018-01-28 LAB — POCT URINALYSIS DIPSTICK
Blood, UA: NEGATIVE
Glucose, UA: NEGATIVE
Ketones, UA: NEGATIVE
LEUKOCYTES UA: NEGATIVE
NITRITE UA: NEGATIVE
PROTEIN UA: NEGATIVE

## 2018-01-28 MED ORDER — ONDANSETRON 8 MG PO TBDP: 8.0000 mg | ORAL_TABLET | Freq: Three times a day (TID) | ORAL | 0 refills | Status: DC | PRN

## 2018-01-28 NOTE — Progress Notes (Signed)
LOW-RISK PREGNANCY VISIT Patient name: Caroline Barnes MRN [id number]  Date of birth: [birth year] Chief Complaint:   Routine Prenatal Visit (still vomiting)  History of Present Illness:   Caroline Barnes is a 37 y.o. G62P1001 female at [redacted]w[redacted]d with an Estimated Date of Delivery: 05/19/18 being seen today for ongoing management of a low-risk pregnancy.  Today she reports needs refill on zofran, feels it is helping, only vomiting once q am now. Occ constipation.  Contractions: Not present. Vag. Bleeding: None.  Movement: Present. denies leaking of fluid. Review of Systems:   Pertinent items are noted in HPI Denies abnormal vaginal discharge w/ itching/odor/irritation, headaches, visual changes, shortness of breath, chest pain, abdominal pain, severe nausea/vomiting, or problems with urination or bowel movements unless otherwise stated above. Pertinent History Reviewed:  Reviewed past medical,surgical, social, obstetrical and family history.  Reviewed problem list, medications and allergies. Physical Assessment:   Vitals:   01/28/18 1621  BP: 102/60  Pulse: 80  Weight: 177 lb 8 oz (80.5 kg)  Body mass index is 28.65 kg/m.        Physical Examination:   General appearance: Well appearing, and in no distress  Mental status: Alert, oriented to person, place, and time  Skin: Warm & dry  Cardiovascular: Normal heart rate noted  Respiratory: Normal respiratory effort, no distress  Abdomen: Soft, gravid, nontender  Pelvic: Cervical exam deferred         Extremities: Edema: None  Fetal Status: Fetal Heart Rate (bpm): 135 Fundal Height: 23 cm Movement: Present    Results for orders placed or performed in visit on 01/28/18 (from the past 24 hour(s))  POCT urinalysis dipstick   Collection Time: 01/28/18  4:22 PM  Result Value Ref Range   Color, UA     Clarity, UA     Glucose, UA neg    Bilirubin, UA     Ketones, UA neg    Spec Grav, UA  1.010 - 1.025   Blood, UA neg    pH,  UA  5.0 - 8.0   Protein, UA neg    Urobilinogen, UA  0.2 or 1.0 E.U./dL   Nitrite, UA neg    Leukocytes, UA Negative Negative   Appearance     Odor      Assessment & Plan:  1) Low-risk pregnancy G2P1001 at [redacted]w[redacted]d with an Estimated Date of Delivery: 05/19/18   2) N/V of pregnancy, refilled zofran  3) H/O IUGR> will get efw u/s next visit, then 32, 36wks  4) Occ constipation> gave printed prevention/relief measures    Meds:  Meds ordered this encounter  Medications  . ondansetron (ZOFRAN-ODT) 8 MG disintegrating tablet    Sig: Take 1 tablet (8 mg total) by mouth every 8 (eight) hours as needed for nausea or vomiting.    Dispense:  20 tablet    Refill:  0    Order Specific Question:   Supervising Provider    Answer:   Lazaro Arms [[id number]]   Labs/procedures today: none  Plan:  Continue routine obstetrical care   Reviewed: Preterm labor symptoms and general obstetric precautions including but not limited to vaginal bleeding, contractions, leaking of fluid and fetal movement were reviewed in detail with the patient.  All questions were answered  Follow-up: Return in about 1 month (around 02/25/2018) for LROB, PN2, US:EFW.  Orders Placed This Encounter  Procedures  . US OB Follow Up  . POCT urinalysis dipstick   Cheral Marker CNM, WHNP-BC  01/28/2018 4:40 PM

## 2018-01-28 NOTE — Patient Instructions (Addendum)
Caroline Barnes, I greatly value your feedback.  If you receive a survey following your visit with [place] today, we appreciate you taking the time to fill it out.  Thanks, [name], CNM, WHNP-BC   You will have your sugar test next visit.  Please do not eat or drink anything after midnight the night before you come, not even water.  You will be here for at least two hours.     Call the office [phone]) or go to [facility] if:  You begin to have strong, frequent contractions  Your water breaks.  Sometimes it is a big gush of fluid, sometimes it is just a trickle that keeps getting your panties wet or running down your legs  You have vaginal bleeding.  It is normal to have a small amount of spotting if your cervix was checked.   You don't feel your baby moving like normal.  If you don't, get you something to eat and drink and lay down and focus on feeling your baby move.   If your baby is still not moving like normal, you should call the office or go to [facility].  Nausea & Vomiting  Have saltine crackers or pretzels by your bed and eat a few bites before you raise your head out of bed in the morning  Eat small frequent meals throughout the day instead of large meals  Drink plenty of fluids throughout the day to stay hydrated, just don't drink a lot of fluids with your meals.  This can make your stomach fill up faster making you feel sick  Do not brush your teeth right after you eat  Products with real ginger are good for nausea, like ginger ale and ginger hard candy Make sure it says made with real ginger!  Sucking on sour candy like lemon heads is also good for nausea  If your prenatal vitamins make you nauseated, take them at night so you will sleep through the nausea  Sea Bands  If you feel like you need medicine for the nausea & vomiting please let us know  If you are unable to keep any fluids or food down please let us know  Constipation  Drink plenty of  fluid, preferably water, throughout the day  Eat foods high in fiber such as fruits, vegetables, and grains  Exercise, such as walking, is a good way to keep your bowels regular  Drink warm fluids, especially warm prune juice, or decaf coffee  Eat a 1/2 cup of real oatmeal (not instant), 1/2 cup applesauce, and 1/2-1 cup warm prune juice every day  If needed, you may take Colace (docusate sodium) stool softener once or twice a day to help keep the stool soft. If you are pregnant, wait until you are out of your first trimester (12-14 weeks of pregnancy)  If you still are having problems with constipation, you may take Miralax once daily as needed to help keep your bowels regular.  If you are pregnant, wait until you are out of your first trimester (12-14 weeks of pregnancy)       Second Trimester of Pregnancy The second trimester is from week 13 through week 28, months 4 through 6. The second trimester is often a time when you feel your best. Your body has also adjusted to being pregnant, and you begin to feel better physically. Usually, morning sickness has lessened or quit completely, you may have more energy, and you may have an increase in appetite. The  second trimester is also a time when the fetus is growing rapidly. At the end of the sixth month, the fetus is about 9 inches long and weighs about 1 pounds. You will likely begin to feel the baby move (quickening) between 18 and 20 weeks of the pregnancy. BODY CHANGES Your body goes through many changes during pregnancy. The changes vary from woman to woman.   Your weight will continue to increase. You will notice your lower abdomen bulging out.  You may begin to get stretch marks on your hips, abdomen, and breasts.  You may develop headaches that can be relieved by medicines approved by your health care provider.  You may urinate more often because the fetus is pressing on your bladder.  You may develop or continue to have  heartburn as a result of your pregnancy.  You may develop constipation because certain hormones are causing the muscles that push waste through your intestines to slow down.  You may develop hemorrhoids or swollen, bulging veins (varicose veins).  You may have back pain because of the weight gain and pregnancy hormones relaxing your joints between the bones in your pelvis and as a result of a shift in weight and the muscles that support your balance.  Your breasts will continue to grow and be tender.  Your gums may bleed and may be sensitive to brushing and flossing.  Dark spots or blotches (chloasma, mask of pregnancy) may develop on your face. This will likely fade after the baby is born.  A dark line from your belly button to the pubic area (linea nigra) may appear. This will likely fade after the baby is born.  You may have changes in your hair. These can include thickening of your hair, rapid growth, and changes in texture. Some women also have hair loss during or after pregnancy, or hair that feels dry or thin. Your hair will most likely return to normal after your baby is born. WHAT TO EXPECT AT YOUR PRENATAL VISITS During a routine prenatal visit:  You will be weighed to make sure you and the fetus are growing normally.  Your blood pressure will be taken.  Your abdomen will be measured to track your baby's growth.  The fetal heartbeat will be listened to.  Any test results from the previous visit will be discussed. Your health care provider may ask you:  How you are feeling.  If you are feeling the baby move.  If you have had any abnormal symptoms, such as leaking fluid, bleeding, severe headaches, or abdominal cramping.  If you have any questions. Other tests that may be performed during your second trimester include:  Blood tests that check for:  Low iron levels (anemia).  Gestational diabetes (between 24 and 28 weeks).  Rh antibodies.  Urine tests to check  for infections, diabetes, or protein in the urine.  An ultrasound to confirm the proper growth and development of the baby.  An amniocentesis to check for possible genetic problems.  Fetal screens for spina bifida and Down syndrome. HOME CARE INSTRUCTIONS   Avoid all smoking, herbs, alcohol, and unprescribed drugs. These chemicals affect the formation and growth of the baby.  Follow your health care provider's instructions regarding medicine use. There are medicines that are either safe or unsafe to take during pregnancy.  Exercise only as directed by your health care provider. Experiencing uterine cramps is a good sign to stop exercising.  Continue to eat regular, healthy meals.  Wear a good support  bra for breast tenderness.  Do not use hot tubs, steam rooms, or saunas.  Wear your seat belt at all times when driving.  Avoid raw meat, uncooked cheese, cat litter boxes, and soil used by cats. These carry germs that can cause birth defects in the baby.  Take your prenatal vitamins.  Try taking a stool softener (if your health care provider approves) if you develop constipation. Eat more high-fiber foods, such as fresh vegetables or fruit and whole grains. Drink plenty of fluids to keep your urine clear or pale yellow.  Take warm sitz baths to soothe any pain or discomfort caused by hemorrhoids. Use hemorrhoid cream if your health care provider approves.  If you develop varicose veins, wear support hose. Elevate your feet for 15 minutes, 3-4 times a day. Limit salt in your diet.  Avoid heavy lifting, wear low heel shoes, and practice good posture.  Rest with your legs elevated if you have leg cramps or low back pain.  Visit your dentist if you have not gone yet during your pregnancy. Use a soft toothbrush to brush your teeth and be gentle when you floss.  A sexual relationship may be continued unless your health care provider directs you otherwise.  Continue to go to all your  prenatal visits as directed by your health care provider. SEEK MEDICAL CARE IF:   You have dizziness.  You have mild pelvic cramps, pelvic pressure, or nagging pain in the abdominal area.  You have persistent nausea, vomiting, or diarrhea.  You have a bad smelling vaginal discharge.  You have pain with urination. SEEK IMMEDIATE MEDICAL CARE IF:   You have a fever.  You are leaking fluid from your vagina.  You have spotting or bleeding from your vagina.  You have severe abdominal cramping or pain.  You have rapid weight gain or loss.  You have shortness of breath with chest pain.  You notice sudden or extreme swelling of your face, hands, ankles, feet, or legs.  You have not felt your baby move in over an hour.  You have severe headaches that do not go away with medicine.  You have vision changes. Document Released: 09/02/2001 Document Revised: 09/13/2013 Document Reviewed: 11/09/2012 [facility] Patient Information 2015 [place], [place]. This information is not intended to replace advice given to you by your health care provider. Make sure you discuss any questions you have with your health care provider.

## 2018-02-25 ENCOUNTER — Ambulatory Visit (INDEPENDENT_AMBULATORY_CARE_PROVIDER_SITE_OTHER): Payer: BLUE CROSS/BLUE SHIELD | Admitting: Women's Health

## 2018-02-25 ENCOUNTER — Ambulatory Visit (INDEPENDENT_AMBULATORY_CARE_PROVIDER_SITE_OTHER): Payer: BLUE CROSS/BLUE SHIELD

## 2018-02-25 ENCOUNTER — Other Ambulatory Visit: Payer: BLUE CROSS/BLUE SHIELD

## 2018-02-25 ENCOUNTER — Encounter: Payer: Self-pay | Admitting: Women's Health

## 2018-02-25 VITALS — BP 104/59 | HR 77 | Wt 176.2 lb

## 2018-02-25 DIAGNOSIS — Z331 Pregnant state, incidental: Secondary | ICD-10-CM

## 2018-02-25 DIAGNOSIS — Z3A28 28 weeks gestation of pregnancy: Secondary | ICD-10-CM

## 2018-02-25 DIAGNOSIS — O09293 Supervision of pregnancy with other poor reproductive or obstetric history, third trimester: Secondary | ICD-10-CM

## 2018-02-25 DIAGNOSIS — Z3483 Encounter for supervision of other normal pregnancy, third trimester: Secondary | ICD-10-CM | POA: Diagnosis not present

## 2018-02-25 DIAGNOSIS — Z1389 Encounter for screening for other disorder: Secondary | ICD-10-CM

## 2018-02-25 DIAGNOSIS — Z3482 Encounter for supervision of other normal pregnancy, second trimester: Secondary | ICD-10-CM

## 2018-02-25 DIAGNOSIS — O219 Vomiting of pregnancy, unspecified: Secondary | ICD-10-CM

## 2018-02-25 DIAGNOSIS — Z131 Encounter for screening for diabetes mellitus: Secondary | ICD-10-CM | POA: Diagnosis not present

## 2018-02-25 DIAGNOSIS — Z8759 Personal history of other complications of pregnancy, childbirth and the puerperium: Secondary | ICD-10-CM

## 2018-02-25 LAB — POCT URINALYSIS DIPSTICK
Blood, UA: NEGATIVE
GLUCOSE UA: NEGATIVE
LEUKOCYTES UA: NEGATIVE
NITRITE UA: NEGATIVE
PROTEIN UA: NEGATIVE

## 2018-02-25 MED ORDER — ONDANSETRON 8 MG PO TBDP: 8.0000 mg | ORAL_TABLET | Freq: Three times a day (TID) | ORAL | 0 refills | Status: DC | PRN

## 2018-02-25 NOTE — Patient Instructions (Signed)
Caroline Barnes, I greatly value your feedback.  If you receive a survey following your visit with us today, we appreciate you taking the time to fill it out.  Thanks, Joellyn HaffKim Renald Haithcock, CNM, WHNP-BC   Call the office [phone]) or go to [facility] if:  You begin to have strong, frequent contractions  Your water breaks.  Sometimes it is a big gush of fluid, sometimes it is just a trickle that keeps getting your panties wet or running down your legs  You have vaginal bleeding.  It is normal to have a small amount of spotting if your cervix was checked.   You don't feel your baby moving like normal.  If you don't, get you something to eat and drink and lay down and focus on feeling your baby move.  You should feel at least 10 movements in 2 hours.  If you don't, you should call the office or [facility] to [facility].    Tdap Vaccine  It is recommended that you get the Tdap vaccine during the third trimester of EACH pregnancy to help protect your baby from getting pertussis (whooping cough)  27-36 weeks is the BEST time to do this so that you can pass the protection on to your baby. During pregnancy is better than after pregnancy, but if you are unable to get it during pregnancy it will be offered at the hospital.   You can get this vaccine at the health department or your family doctor  Everyone who will be around your baby should also be up-to-date on their vaccines. Adults (who are not pregnant) only need 1 dose of Tdap during adulthood.   Third Trimester of Pregnancy The third trimester is from week 29 through week 42, months 7 through 9. The third trimester is a time when the fetus is growing rapidly. At the end of the ninth month, the fetus is about 20 inches in length and weighs 6-10 pounds.  BODY CHANGES Your body goes through many changes during pregnancy. The changes vary from woman to woman.   Your weight will continue to increase. You can expect to gain 25-35 pounds (11-16  kg) by the end of the pregnancy.  You may begin to get stretch marks on your hips, abdomen, and breasts.  You may urinate more often because the fetus is moving lower into your pelvis and pressing on your bladder.  You may develop or continue to have heartburn as a result of your pregnancy.  You may develop constipation because certain hormones are causing the muscles that push waste through your intestines to slow down.  You may develop hemorrhoids or swollen, bulging veins (varicose veins).  You may have pelvic pain because of the weight gain and pregnancy hormones relaxing your joints between the bones in your pelvis. Backaches may result from overexertion of the muscles supporting your posture.  You may have changes in your hair. These can include thickening of your hair, rapid growth, and changes in texture. Some women also have hair loss during or after pregnancy, or hair that feels dry or thin. Your hair will most likely return to normal after your baby is born.  Your breasts will continue to grow and be tender. A yellow discharge may leak from your breasts called colostrum.  Your belly button may stick out.  You may feel short of breath because of your expanding uterus.  You may notice the fetus "dropping," or moving lower in your abdomen.  You may have a bloody  mucus discharge. This usually occurs a few days to a week before labor begins.  Your cervix becomes thin and soft (effaced) near your due date. WHAT TO EXPECT AT YOUR PRENATAL EXAMS  You will have prenatal exams every 2 weeks until week 36. Then, you will have weekly prenatal exams. During a routine prenatal visit:  You will be weighed to make sure you and the fetus are growing normally.  Your blood pressure is taken.  Your abdomen will be measured to track your baby's growth.  The fetal heartbeat will be listened to.  Any test results from the previous visit will be discussed.  You may have a cervical check  near your due date to see if you have effaced. At around 36 weeks, your caregiver will check your cervix. At the same time, your caregiver will also perform a test on the secretions of the vaginal tissue. This test is to determine if a type of bacteria, Group B streptococcus, is present. Your caregiver will explain this further. Your caregiver may ask you:  What your birth plan is.  How you are feeling.  If you are feeling the baby move.  If you have had any abnormal symptoms, such as leaking fluid, bleeding, severe headaches, or abdominal cramping.  If you have any questions. Other tests or screenings that may be performed during your third trimester include:  Blood tests that check for low iron levels (anemia).  Fetal testing to check the health, activity level, and growth of the fetus. Testing is done if you have certain medical conditions or if there are problems during the pregnancy. FALSE LABOR You may feel small, irregular contractions that eventually go away. These are called Braxton Hicks contractions, or false labor. Contractions may last for hours, days, or even weeks before true labor sets in. If contractions come at regular intervals, intensify, or become painful, it is best to be seen by your caregiver.  SIGNS OF LABOR   Menstrual-like cramps.  Contractions that are 5 minutes apart or less.  Contractions that start on the top of the uterus and spread down to the lower abdomen and back.  A sense of increased pelvic pressure or back pain.  A watery or bloody mucus discharge that comes from the vagina. If you have any of these signs before the 37th week of pregnancy, call your caregiver right away. You need to go to the hospital to get checked immediately. HOME CARE INSTRUCTIONS   Avoid all smoking, herbs, alcohol, and unprescribed drugs. These chemicals affect the formation and growth of the baby.  Follow your caregiver's instructions regarding medicine use. There are  medicines that are either safe or unsafe to take during pregnancy.  Exercise only as directed by your caregiver. Experiencing uterine cramps is a good sign to stop exercising.  Continue to eat regular, healthy meals.  Wear a good support bra for breast tenderness.  Do not use hot tubs, steam rooms, or saunas.  Wear your seat belt at all times when driving.  Avoid raw meat, uncooked cheese, cat litter boxes, and soil used by cats. These carry germs that can cause birth defects in the baby.  Take your prenatal vitamins.  Try taking a stool softener (if your caregiver approves) if you develop constipation. Eat more high-fiber foods, such as fresh vegetables or fruit and whole grains. Drink plenty of fluids to keep your urine clear or pale yellow.  Take warm sitz baths to soothe any pain or discomfort caused by hemorrhoids.  Use hemorrhoid cream if your caregiver approves.  If you develop varicose veins, wear support hose. Elevate your feet for 15 minutes, 3-4 times a day. Limit salt in your diet.  Avoid heavy lifting, wear low heal shoes, and practice good posture.  Rest a lot with your legs elevated if you have leg cramps or low back pain.  Visit your dentist if you have not gone during your pregnancy. Use a soft toothbrush to brush your teeth and be gentle when you floss.  A sexual relationship may be continued unless your caregiver directs you otherwise.  Do not travel far distances unless it is absolutely necessary and only with the approval of your caregiver.  Take prenatal classes to understand, practice, and ask questions about the labor and delivery.  Make a trial run to the hospital.  Pack your hospital bag.  Prepare the baby's nursery.  Continue to go to all your prenatal visits as directed by your caregiver. SEEK MEDICAL CARE IF:  You are unsure if you are in labor or if your water has broken.  You have dizziness.  You have mild pelvic cramps, pelvic pressure, or  nagging pain in your abdominal area.  You have persistent nausea, vomiting, or diarrhea.  You have a bad smelling vaginal discharge.  You have pain with urination. SEEK IMMEDIATE MEDICAL CARE IF:   You have a fever.  You are leaking fluid from your vagina.  You have spotting or bleeding from your vagina.  You have severe abdominal cramping or pain.  You have rapid weight loss or gain.  You have shortness of breath with chest pain.  You notice sudden or extreme swelling of your face, hands, ankles, feet, or legs.  You have not felt your baby move in over an hour.  You have severe headaches that do not go away with medicine.  You have vision changes. Document Released: 09/02/2001 Document Revised: 09/13/2013 Document Reviewed: 11/09/2012 [facility] Patient Information 2015 [place], [place]. This information is not intended to replace advice given to you by your health care provider. Make sure you discuss any questions you have with your health care provider.

## 2018-02-25 NOTE — Progress Notes (Signed)
US 28+1 wks,cephalic,cx 3.7 cm,anterior pl gr 0,normal ovaries bilat,afi 16 cm,fhr 133 bpm,EFW 1096 g 20%

## 2018-02-25 NOTE — Progress Notes (Signed)
LOW-RISK PREGNANCY VISIT Patient name: Caroline Barnes MRN [id number]  Date of birth: 03-08-1981 Chief Complaint:   Routine Prenatal Visit (PN2, [place] today; vomiting everyday)  History of Present Illness:   Caroline Barnes is a 37 y.o. G79P1001 female at [redacted]w[redacted]d with an Estimated Date of Delivery: 05/19/18 being seen today for ongoing management of a low-risk pregnancy.  Today she reports still vomits in am, zofran helps when she takes it, usually takes 1-2x/day. Eats well throughout the rest of the day. Still contemplating BTL vs. Nexplanon. Contractions: Not present. Vag. Bleeding: None.  Movement: Present. denies leaking of fluid. Review of Systems:   Pertinent items are noted in HPI Denies abnormal vaginal discharge w/ itching/odor/irritation, headaches, visual changes, shortness of breath, chest pain, abdominal pain, severe nausea/vomiting, or problems with urination or bowel movements unless otherwise stated above. Pertinent History Reviewed:  Reviewed past medical,surgical, social, obstetrical and family history.  Reviewed problem list, medications and allergies. Physical Assessment:   Vitals:   02/25/18 0942  BP: (!) 104/59  Pulse: 77  Weight: 176 lb 3.2 oz (79.9 kg)  Body mass index is 28.44 kg/m.        Physical Examination:   General appearance: Well appearing, and in no distress  Mental status: Alert, oriented to person, place, and time  Skin: Warm & dry  Cardiovascular: Normal heart rate noted  Respiratory: Normal respiratory effort, no distress  Abdomen: Soft, gravid, nontender  Pelvic: Cervical exam deferred         Extremities: Edema: None  Fetal Status: Fetal Heart Rate (bpm): 133 u/s Fundal Height: 25 cm Movement: Present    [place] 28+1 wks,cephalic,cx 3.7 cm,anterior pl gr 0,normal ovaries bilat,afi 16 cm,fhr 133 bpm,EFW 1096 g 20%  Results for orders placed or performed in visit on 02/25/18 (from the past 24 hour(s))  POCT urinalysis dipstick   Collection Time: 02/25/18  9:44 AM  Result Value Ref Range   Color, UA     Clarity, UA     Glucose, UA Negative Negative   Bilirubin, UA     Ketones, UA trace    Spec Grav, UA  1.010 - 1.025   Blood, UA neg    pH, UA  5.0 - 8.0   Protein, UA Negative Negative   Urobilinogen, UA  0.2 or 1.0 E.U./dL   Nitrite, UA neg    Leukocytes, UA Negative Negative   Appearance     Odor      Assessment & Plan:  1) Low-risk pregnancy G2P1001 at [redacted]w[redacted]d with an Estimated Date of Delivery: 05/19/18   2) Barnes/O IUGR, efw 20% today, will continue to monitor q 4wks  3) N/V> refilled zofran, try taking at night to see if will help am vomiting  4) Desires BTL> still b/w BTL and Nexplanon, discussed risks and benefits, consent signed today   Meds:  Meds ordered this encounter  Medications  . ondansetron (ZOFRAN-ODT) 8 MG disintegrating tablet    Sig: Take 1 tablet (8 mg total) by mouth every 8 (eight) hours as needed for nausea or vomiting.    Dispense:  30 tablet    Refill:  0    Order Specific Question:   Supervising Provider    Answer:   Caroline Barnes [[id number]]   Labs/procedures today: pn2, u/s, declines tdap today-wants next visit  Plan:  Continue routine obstetrical care   Reviewed: Preterm labor symptoms and general obstetric precautions including but not limited to vaginal bleeding, contractions, leaking of fluid  and fetal movement were reviewed in detail with the patient.  Recommended Tdap at HD/PCP per CDC guidelines. All questions were answered  Follow-up: Return in about 1 month (around 03/25/2018) for LROB, tdap, US:EFW, Sign BTL consent today.  Orders Placed This Encounter  Procedures  . POCT urinalysis dipstick   Cheral MarkerKimberly R Cameren Barnes CNM, [facility] 02/25/2018 11:16 AM

## 2018-02-26 LAB — GLUCOSE TOLERANCE, 2 HOURS W/ 1HR
GLUCOSE, 1 HOUR: 130 mg/dL (ref 65–179)
GLUCOSE, FASTING: 70 mg/dL (ref 65–91)
Glucose, 2 hour: 86 mg/dL (ref 65–152)

## 2018-02-26 LAB — CBC
HEMOGLOBIN: 11 g/dL — AB (ref 11.1–15.9)
Hematocrit: 32.7 % — ABNORMAL LOW (ref 34.0–46.6)
MCH: 27.4 pg (ref 26.6–33.0)
MCHC: 33.6 g/dL (ref 31.5–35.7)
MCV: 81 fL (ref 79–97)
PLATELETS: 235 10*3/uL (ref 150–450)
RBC: 4.02 x10E6/uL (ref 3.77–5.28)
RDW: 13.8 % (ref 12.3–15.4)
WBC: 4.7 10*3/uL (ref 3.4–10.8)

## 2018-02-26 LAB — HIV ANTIBODY (ROUTINE TESTING W REFLEX): HIV Screen 4th Generation wRfx: NONREACTIVE

## 2018-02-26 LAB — RPR: RPR Ser Ql: NONREACTIVE

## 2018-02-26 LAB — ANTIBODY SCREEN: ANTIBODY SCREEN: NEGATIVE

## 2018-03-08 ENCOUNTER — Telehealth: Payer: Self-pay | Admitting: *Deleted

## 2018-03-09 NOTE — Telephone Encounter (Signed)
LMOVM that if she thinks she has a yeast infection to try Monistat or generic OTC. If she has tried any of these and still itching, to let us know.

## 2018-03-24 ENCOUNTER — Other Ambulatory Visit: Payer: Self-pay | Admitting: Women's Health

## 2018-03-24 DIAGNOSIS — Z8759 Personal history of other complications of pregnancy, childbirth and the puerperium: Secondary | ICD-10-CM

## 2018-03-26 ENCOUNTER — Encounter: Payer: Self-pay | Admitting: Obstetrics & Gynecology

## 2018-03-26 ENCOUNTER — Other Ambulatory Visit: Payer: Self-pay | Admitting: Women's Health

## 2018-03-26 ENCOUNTER — Ambulatory Visit (INDEPENDENT_AMBULATORY_CARE_PROVIDER_SITE_OTHER): Payer: BLUE CROSS/BLUE SHIELD | Admitting: Obstetrics & Gynecology

## 2018-03-26 ENCOUNTER — Ambulatory Visit (INDEPENDENT_AMBULATORY_CARE_PROVIDER_SITE_OTHER): Payer: BLUE CROSS/BLUE SHIELD

## 2018-03-26 VITALS — BP 103/67 | HR 77 | Wt 174.5 lb

## 2018-03-26 DIAGNOSIS — Z3A32 32 weeks gestation of pregnancy: Secondary | ICD-10-CM

## 2018-03-26 DIAGNOSIS — Z8759 Personal history of other complications of pregnancy, childbirth and the puerperium: Secondary | ICD-10-CM

## 2018-03-26 DIAGNOSIS — O09293 Supervision of pregnancy with other poor reproductive or obstetric history, third trimester: Secondary | ICD-10-CM

## 2018-03-26 DIAGNOSIS — Z331 Pregnant state, incidental: Secondary | ICD-10-CM

## 2018-03-26 DIAGNOSIS — O365930 Maternal care for other known or suspected poor fetal growth, third trimester, not applicable or unspecified: Secondary | ICD-10-CM

## 2018-03-26 DIAGNOSIS — O26843 Uterine size-date discrepancy, third trimester: Secondary | ICD-10-CM

## 2018-03-26 DIAGNOSIS — Z3403 Encounter for supervision of normal first pregnancy, third trimester: Secondary | ICD-10-CM

## 2018-03-26 DIAGNOSIS — Z1389 Encounter for screening for other disorder: Secondary | ICD-10-CM

## 2018-03-26 DIAGNOSIS — O0993 Supervision of high risk pregnancy, unspecified, third trimester: Secondary | ICD-10-CM

## 2018-03-26 LAB — POCT URINALYSIS DIPSTICK
Blood, UA: NEGATIVE
Glucose, UA: NEGATIVE
KETONES UA: NEGATIVE
Nitrite, UA: NEGATIVE
Protein, UA: POSITIVE — AB

## 2018-03-26 MED ORDER — TERCONAZOLE 0.4 % VA CREA: 1.0000 | TOPICAL_CREAM | Freq: Every day | VAGINAL | 0 refills | Status: DC

## 2018-03-26 MED ORDER — ONDANSETRON 8 MG PO TBDP: 8.0000 mg | ORAL_TABLET | Freq: Three times a day (TID) | ORAL | 1 refills | Status: DC | PRN

## 2018-03-26 NOTE — Progress Notes (Signed)
HIGH-RISK PREGNANCY VISIT Patient name: Caroline Barnes MRN [id number]  Date of birth: 02/05/1981 Chief Complaint:   Routine Prenatal Visit (US today; requests Zofran; vaginal itching)  History of Present Illness:   Caroline Barnes is a 37 y.o. 472P1001 female at 642w2d with an Estimated Date of Delivery: 05/19/18 being seen today for ongoing management of a high-risk pregnancy complicated by FGR with elevated Doppler ratios.  Today she reports itchy vaginal discharge. Contractions: Not present. Vag. Bleeding: None.  Movement: Present. denies leaking of fluid.  Review of Systems:   Pertinent items are noted in HPI Denies abnormal vaginal discharge w/ itching/odor/irritation, headaches, visual changes, shortness of breath, chest pain, abdominal pain, severe nausea/vomiting, or problems with urination or bowel movements unless otherwise stated above. Pertinent History Reviewed:  Reviewed past medical,surgical, social, obstetrical and family history.  Reviewed problem list, medications and allergies. Physical Assessment:   Vitals:   03/26/18 1245  BP: 103/67  Pulse: 77  Weight: 174 lb 8 oz (79.2 kg)  Body mass index is 28.17 kg/m.           Physical Examination:   General appearance: alert, well appearing, and in no distress  Mental status: alert, oriented to person, place, and time  Skin: warm & dry   Extremities: Edema: None    Cardiovascular: normal heart rate noted  Respiratory: normal respiratory effort, no distress  Abdomen: gravid, soft, non-tender  Pelvic: Cervical exam deferred       + yeast on SSE  Fetal Status:     Movement: Present    Fetal Surveillance Testing today: BPP   Results for orders placed or performed in visit on 03/26/18 (from the past 24 hour(s))  POCT urinalysis dipstick   Collection Time: 03/26/18 12:46 PM  Result Value Ref Range   Color, UA     Clarity, UA     Glucose, UA Negative Negative   Bilirubin, UA     Ketones, UA neg    Spec  Grav, UA  1.010 - 1.025   Blood, UA neg    pH, UA  5.0 - 8.0   Protein, UA Positive (A) Negative   Urobilinogen, UA  0.2 or 1.0 E.U./dL   Nitrite, UA neg    Leukocytes, UA Moderate (2+) (A) Negative   Appearance     Odor      Assessment & Plan:  1) High-risk pregnancy G2P1001 at 1842w2d with an Estimated Date of Delivery: 05/19/18   2) FGR, stable, 6%  3) Elevated Doppler ratios, stable, 95%  Meds:  Meds ordered this encounter  Medications  . ondansetron (ZOFRAN ODT) 8 MG disintegrating tablet    Sig: Take 1 tablet (8 mg total) by mouth every 8 (eight) hours as needed for nausea or vomiting.    Dispense:  30 tablet    Refill:  1  . terconazole (TERAZOL 7) 0.4 % vaginal cream    Sig: Place 1 applicator vaginally at bedtime.    Dispense:  45 g    Refill:  0    Labs/procedures today: sonogram  Treatment Plan:  Twice weekly surveillance, NST alternating with sonogram, IOL 37-39 weeks depending on clinical course  Reviewed: Preterm labor symptoms and general obstetric precautions including but not limited to vaginal bleeding, contractions, leaking of fluid and fetal movement were reviewed in detail with the patient.  All questions were answered.  Follow-up: Return in about 4 days (around 03/30/2018) for NST, HROB.  Orders Placed This Encounter  Procedures  .  POCT urinalysis dipstick   Lazaro Arms MD 03/26/2018 1:18 PM

## 2018-03-26 NOTE — Progress Notes (Signed)
US 32+2 wks,cephalic,BPP 8/8,anterior pl gr 0,normal ovaries bilat,afi 13 cm,FHR 131 bpm,elevated UAD w/diastolic flow,RI .82,.76,.76,.72=95%

## 2018-03-29 ENCOUNTER — Encounter: Payer: Self-pay | Admitting: Women's Health

## 2018-04-02 ENCOUNTER — Ambulatory Visit (INDEPENDENT_AMBULATORY_CARE_PROVIDER_SITE_OTHER): Payer: BLUE CROSS/BLUE SHIELD | Admitting: Women's Health

## 2018-04-02 ENCOUNTER — Encounter: Payer: Self-pay | Admitting: Women's Health

## 2018-04-02 VITALS — BP 100/58 | HR 75 | Wt 177.2 lb

## 2018-04-02 DIAGNOSIS — Z3A33 33 weeks gestation of pregnancy: Secondary | ICD-10-CM

## 2018-04-02 DIAGNOSIS — Z331 Pregnant state, incidental: Secondary | ICD-10-CM

## 2018-04-02 DIAGNOSIS — O365930 Maternal care for other known or suspected poor fetal growth, third trimester, not applicable or unspecified: Secondary | ICD-10-CM | POA: Diagnosis not present

## 2018-04-02 DIAGNOSIS — O0993 Supervision of high risk pregnancy, unspecified, third trimester: Secondary | ICD-10-CM

## 2018-04-02 DIAGNOSIS — Z1389 Encounter for screening for other disorder: Secondary | ICD-10-CM

## 2018-04-02 DIAGNOSIS — Z23 Encounter for immunization: Secondary | ICD-10-CM | POA: Diagnosis not present

## 2018-04-02 LAB — POCT URINALYSIS DIPSTICK
Blood, UA: NEGATIVE
GLUCOSE UA: NEGATIVE
KETONES UA: NEGATIVE
Leukocytes, UA: NEGATIVE
Nitrite, UA: NEGATIVE
Protein, UA: POSITIVE — AB

## 2018-04-02 NOTE — Progress Notes (Signed)
   HIGH-RISK PREGNANCY VISIT Patient name: Caroline Barnes Stolze MRN [id number]  Date of birth: 11/16/1980 Chief Complaint:   High Risk Gestation (NST)  History of Present Illness:   Caroline Barnes Zamor is a 37 y.o. 562P1001 female at 353w2d with an Estimated Date of Delivery: 05/19/18 being seen today for ongoing management of a high-risk pregnancy complicated by FGR 6% w/ elevated dopplers.  Today she reports some occ pressure. Contractions: Not present.  .  Movement: Present. denies leaking of fluid.  Review of Systems:   Pertinent items are noted in HPI Denies abnormal vaginal discharge w/ itching/odor/irritation, headaches, visual changes, shortness of breath, chest pain, abdominal pain, severe nausea/vomiting, or problems with urination or bowel movements unless otherwise stated above. Pertinent History Reviewed:  Reviewed past medical,surgical, social, obstetrical and family history.  Reviewed problem list, medications and allergies. Physical Assessment:   Vitals:   04/02/18 1213  BP: (!) 100/58  Pulse: 75  Weight: 177 lb 3.2 oz (80.4 kg)  Body mass index is 28.6 kg/m.           Physical Examination:   General appearance: alert, well appearing, and in no distress  Mental status: alert, oriented to person, place, and time  Skin: warm & dry   Extremities: Edema: None    Cardiovascular: normal heart rate noted  Respiratory: normal respiratory effort, no distress  Abdomen: gravid, soft, non-tender  Pelvic: Cervical exam deferred         Fetal Status: Fetal Heart Rate (bpm): 120 Fundal Height: 30 cm Movement: Present     Fetal Surveillance Testing today: NST: FHR baseline 120 bpm, Variability: moderate, Accelerations:present, Decelerations:  Absent= Cat 1/Reactive Toco: 1uc     Results for orders placed or performed in visit on 04/02/18 (from the past 24 hour(s))  POCT urinalysis dipstick   Collection Time: 04/02/18 12:18 PM  Result Value Ref Range   Color, UA     Clarity, UA     Glucose, UA Negative Negative   Bilirubin, UA     Ketones, UA neg    Spec Grav, UA  1.010 - 1.025   Blood, UA neg    pH, UA  5.0 - 8.0   Protein, UA Positive (A) Negative   Urobilinogen, UA  0.2 or 1.0 E.U./dL   Nitrite, UA neg    Leukocytes, UA Negative Negative   Appearance     Odor      Assessment & Plan:  1) High-risk pregnancy G2P1001 at 353w2d with an Estimated Date of Delivery: 05/19/18   2) FGR, 6% w/ elevated dopplers  Meds: No orders of the defined types were placed in this encounter.  Labs/procedures today: nst, tdap  Treatment Plan:  Growth u/s @ 34, 36, 38wks    2x/wk testing nst alt w/ BPP/Dopp @ 32wks           IOL prn or 39wks:___   Reviewed: Preterm labor symptoms and general obstetric precautions including but not limited to vaginal bleeding, contractions, leaking of fluid and fetal movement were reviewed in detail with the patient.  All questions were answered.  Follow-up: Return for Tues for HROB and bpp/dopp u/s, Fri for hrob/nst x 4wks.  Orders Placed This Encounter  Procedures  . US MFM FETAL BPP WO NON STRESS  . US MFM UA CORD DOPPLER  . Tdap vaccine greater than or equal to 35yo IM  . POCT urinalysis dipstick   Cheral MarkerKimberly R Alekhya Gravlin CNM, [facility]NP-BC 04/02/2018 2:01 PM

## 2018-04-02 NOTE — Patient Instructions (Signed)
Caroline HinesLashonda P Barnes, I greatly value your feedback.  If you receive a survey following your visit with us today, we appreciate you taking the time to fill it out.  Thanks, Joellyn HaffKim Laketra Bowdish, CNM, WHNP-BC   Call the office [phone]) or go to [facility] if:  You begin to have strong, frequent contractions  Your water breaks.  Sometimes it is a big gush of fluid, sometimes it is just a trickle that keeps getting your panties wet or running down your legs  You have vaginal bleeding.  It is normal to have a small amount of spotting if your cervix was checked.   You don't feel your baby moving like normal.  If you don't, get you something to eat and drink and lay down and focus on feeling your baby move.  You should feel at least 10 movements in 2 hours.  If you don't, you should call the office or go to [facility].   Preterm Labor and Birth Information The normal length of a pregnancy is 39-41 weeks. Preterm labor is when labor starts before 37 completed weeks of pregnancy. What are the risk factors for preterm labor? Preterm labor is more likely to occur in women who:  Have certain infections during pregnancy such as a bladder infection, sexually transmitted infection, or infection inside the uterus (chorioamnionitis).  Have a shorter-than-normal cervix.  Have gone into preterm labor before.  Have had surgery on their cervix.  Are younger than age 35 or older than age 12235.  Are African American.  Are pregnant with twins or multiple babies (multiple gestation).  Take street drugs or smoke while pregnant.  Do not gain enough weight while pregnant.  Became pregnant shortly after having been pregnant.  What are the symptoms of preterm labor? Symptoms of preterm labor include:  Cramps similar to those that can happen during a menstrual period. The cramps may happen with diarrhea.  Pain in the abdomen or lower back.  Regular uterine contractions that may feel like  tightening of the abdomen.  A feeling of increased pressure in the pelvis.  Increased watery or bloody mucus discharge from the vagina.  Water breaking (ruptured amniotic sac).  Why is it important to recognize signs of preterm labor? It is important to recognize signs of preterm labor because babies who are born prematurely may not be fully developed. This can put them at an increased risk for:  Long-term (chronic) heart and lung problems.  Difficulty immediately after birth with regulating body systems, including blood sugar, body temperature, heart rate, and breathing rate.  Bleeding in the brain.  Cerebral palsy.  Learning difficulties.  Death.  These risks are highest for babies who are born before 34 weeks of pregnancy. How is preterm labor treated? Treatment depends on the length of your pregnancy, your condition, and the health of your baby. It may involve:  Having a stitch (suture) placed in your cervix to prevent your cervix from opening too early (cerclage).  Taking or being given medicines, such as: ? Hormone medicines. These may be given early in pregnancy to help support the pregnancy. ? Medicine to stop contractions. ? Medicines to help mature the baby's lungs. These may be prescribed if the risk of delivery is high. ? Medicines to prevent your baby from developing cerebral palsy.  If the labor happens before 34 weeks of pregnancy, you may need to stay in the hospital. What should I do if I think I am in preterm labor? If you think  that you are going into preterm labor, call your health care provider right away. How can I prevent preterm labor in future pregnancies? To increase your chance of having a full-term pregnancy:  Do not use any tobacco products, such as cigarettes, chewing tobacco, and e-cigarettes. If you need help quitting, ask your health care provider.  Do not use street drugs or medicines that have not been prescribed to you during your  pregnancy.  Talk with your health care provider before taking any herbal supplements, even if you have been taking them regularly.  Make sure you gain a healthy amount of weight during your pregnancy.  Watch for infection. If you think that you might have an infection, get it checked right away.  Make sure to tell your health care provider if you have gone into preterm labor before.  This information is not intended to replace advice given to you by your health care provider. Make sure you discuss any questions you have with your health care provider. Document Released: 11/29/2003 Document Revised: 02/19/2016 Document Reviewed: 01/30/2016 Elsevier Interactive Patient Education  2018 [employer].

## 2018-04-09 ENCOUNTER — Ambulatory Visit (INDEPENDENT_AMBULATORY_CARE_PROVIDER_SITE_OTHER): Payer: BLUE CROSS/BLUE SHIELD | Admitting: Obstetrics & Gynecology

## 2018-04-09 ENCOUNTER — Encounter: Payer: Self-pay | Admitting: Obstetrics & Gynecology

## 2018-04-09 ENCOUNTER — Other Ambulatory Visit: Payer: Self-pay

## 2018-04-09 VITALS — BP 92/52 | HR 73 | Wt 176.0 lb

## 2018-04-09 DIAGNOSIS — Z331 Pregnant state, incidental: Secondary | ICD-10-CM

## 2018-04-09 DIAGNOSIS — O0993 Supervision of high risk pregnancy, unspecified, third trimester: Secondary | ICD-10-CM

## 2018-04-09 DIAGNOSIS — O365930 Maternal care for other known or suspected poor fetal growth, third trimester, not applicable or unspecified: Secondary | ICD-10-CM | POA: Diagnosis not present

## 2018-04-09 DIAGNOSIS — Z3A34 34 weeks gestation of pregnancy: Secondary | ICD-10-CM

## 2018-04-09 DIAGNOSIS — Z1389 Encounter for screening for other disorder: Secondary | ICD-10-CM

## 2018-04-09 LAB — POCT URINALYSIS DIPSTICK OB
Blood, UA: NEGATIVE
Glucose, UA: NEGATIVE — AB
Ketones, UA: NEGATIVE
LEUKOCYTES UA: NEGATIVE
NITRITE UA: NEGATIVE
PROTEIN: NEGATIVE

## 2018-04-09 NOTE — Progress Notes (Signed)
   HIGH-RISK PREGNANCY VISIT Patient name: Caroline Barnes  Date of birth: 09/09/1981 Chief Complaint:   High Risk Gestation (NST)  History of Present Illness:   Caroline Barnes is a 37 y.o. 822P1001 female at 7676w2d with an Estimated Date of Delivery: 05/19/18 being seen today for ongoing management of a high-risk pregnancy complicated by FGR with elevated Dopplers.  Today she reports no complaints. Contractions: Not present. Vag. Bleeding: None.  Movement: Present. denies leaking of fluid.  Review of Systems:   Pertinent items are noted in HPI Denies abnormal vaginal discharge w/ itching/odor/irritation, headaches, visual changes, shortness of breath, chest pain, abdominal pain, severe nausea/vomiting, or problems with urination or bowel movements unless otherwise stated above. Pertinent History Reviewed:  Reviewed past medical,surgical, social, obstetrical and family history.  Reviewed problem list, medications and allergies. Physical Assessment:   Vitals:   04/09/18 1209  BP: (!) 92/52  Pulse: 73  Weight: 176 lb (79.8 kg)  Body mass index is 28.41 kg/m.           Physical Examination:   General appearance: alert, well appearing, and in no distress  Mental status: alert, oriented to person, place, and time  Skin: warm & dry   Extremities: Edema: None    Cardiovascular: normal heart rate noted  Respiratory: normal respiratory effort, no distress  Abdomen: gravid, soft, non-tender  Pelvic: Cervical exam deferred         Fetal Status:     Movement: Present    Fetal Surveillance Testing today: reactive NST   Results for orders placed or performed in visit on 04/09/18 (from the past 24 hour(s))  POC Urinalysis Dipstick OB   Collection Time: 04/09/18 12:10 PM  Result Value Ref Range   Color, UA     Clarity, UA     Glucose, UA Negative (A) (none)   Bilirubin, UA     Ketones, UA neg    Spec Grav, UA  1.010 - 1.025   Blood, UA neg    pH, UA  5.0 - 8.0    POC Protein UA Negative Negative, Trace   Urobilinogen, UA  0.2 or 1.0 E.U./dL   Nitrite, UA neg    Leukocytes, UA Negative Negative   Appearance     Odor      Assessment & Plan:  1) High-risk pregnancy G2P1001 at 1876w2d with an Estimated Date of Delivery: 05/19/18   2) FGR with elevated Doppler ratios, stable    Meds: No orders of the defined types were placed in this encounter.   Labs/procedures today: reactive NST  Treatment Plan:  Twice weekly surevillance, sonogram alternating with NST, IOL at 37 weeks or as clinically indicated  Reviewed: Preterm labor symptoms and general obstetric precautions including but not limited to vaginal bleeding, contractions, leaking of fluid and fetal movement were reviewed in detail with the patient.  All questions were answered.  Follow-up: Return in about 4 days (around 04/13/2018) for keep scheduled.  Orders Placed This Encounter  Procedures  . POC Urinalysis Dipstick OB   Lazaro ArmsLuther H Eure  04/09/2018 12:52 PM

## 2018-04-12 ENCOUNTER — Other Ambulatory Visit: Payer: Self-pay | Admitting: Women's Health

## 2018-04-12 DIAGNOSIS — O365990 Maternal care for other known or suspected poor fetal growth, unspecified trimester, not applicable or unspecified: Secondary | ICD-10-CM

## 2018-04-13 ENCOUNTER — Ambulatory Visit (INDEPENDENT_AMBULATORY_CARE_PROVIDER_SITE_OTHER): Payer: BLUE CROSS/BLUE SHIELD | Admitting: Obstetrics & Gynecology

## 2018-04-13 ENCOUNTER — Other Ambulatory Visit: Payer: Self-pay

## 2018-04-13 ENCOUNTER — Other Ambulatory Visit: Payer: BLUE CROSS/BLUE SHIELD

## 2018-04-13 ENCOUNTER — Ambulatory Visit (INDEPENDENT_AMBULATORY_CARE_PROVIDER_SITE_OTHER): Payer: BLUE CROSS/BLUE SHIELD

## 2018-04-13 ENCOUNTER — Encounter: Payer: Self-pay | Admitting: Obstetrics & Gynecology

## 2018-04-13 VITALS — BP 97/64 | HR 73 | Wt 177.0 lb

## 2018-04-13 DIAGNOSIS — O365930 Maternal care for other known or suspected poor fetal growth, third trimester, not applicable or unspecified: Secondary | ICD-10-CM

## 2018-04-13 DIAGNOSIS — Z1389 Encounter for screening for other disorder: Secondary | ICD-10-CM

## 2018-04-13 DIAGNOSIS — Z3A34 34 weeks gestation of pregnancy: Secondary | ICD-10-CM

## 2018-04-13 DIAGNOSIS — O0993 Supervision of high risk pregnancy, unspecified, third trimester: Secondary | ICD-10-CM

## 2018-04-13 DIAGNOSIS — O0990 Supervision of high risk pregnancy, unspecified, unspecified trimester: Secondary | ICD-10-CM

## 2018-04-13 DIAGNOSIS — Z331 Pregnant state, incidental: Secondary | ICD-10-CM

## 2018-04-13 DIAGNOSIS — O365990 Maternal care for other known or suspected poor fetal growth, unspecified trimester, not applicable or unspecified: Secondary | ICD-10-CM

## 2018-04-13 LAB — POCT URINALYSIS DIPSTICK OB
Blood, UA: NEGATIVE
GLUCOSE, UA: NEGATIVE — AB
Ketones, UA: NEGATIVE
Leukocytes, UA: NEGATIVE
Nitrite, UA: NEGATIVE
POC,PROTEIN,UA: NEGATIVE

## 2018-04-13 NOTE — Progress Notes (Signed)
US 34+6 wks,cephalic,anterior pl gr 2,normal ovaries bilat,afi 11 cm,fhr 142 bpm,elevated UAD w/diastolic flow,RI .75,.69,.74,.76=96%,EFW 1955 g 3.7 %

## 2018-04-13 NOTE — Progress Notes (Signed)
   HIGH-RISK PREGNANCY VISIT Patient name: Creig HinesLashonda P Grzesiak MRN [id number]  Date of birth: 05/26/1981 Chief Complaint:   High Risk Gestation (u/s today)  History of Present Illness:   Creig HinesLashonda P Emberton is a 37 y.o. 232P1001 female at 608w6d with an Estimated Date of Delivery: 05/19/18 being seen today for ongoing management of a high-risk pregnancy complicated by FGR with elevated Dopplers.  Today she reports no complaints. Contractions: Not present. Vag. Bleeding: None.  Movement: Present. denies leaking of fluid.  Review of Systems:   Pertinent items are noted in HPI Denies abnormal vaginal discharge w/ itching/odor/irritation, headaches, visual changes, shortness of breath, chest pain, abdominal pain, severe nausea/vomiting, or problems with urination or bowel movements unless otherwise stated above. Pertinent History Reviewed:  Reviewed past medical,surgical, social, obstetrical and family history.  Reviewed problem list, medications and allergies. Physical Assessment:   Vitals:   04/13/18 1507  BP: 97/64  Pulse: 73  Weight: 177 lb (80.3 kg)  Body mass index is 28.57 kg/m.           Physical Examination:   General appearance: alert, well appearing, and in no distress  Mental status: alert, oriented to person, place, and time  Skin: warm & dry   Extremities: Edema: None    Cardiovascular: normal heart rate noted  Respiratory: normal respiratory effort, no distress  Abdomen: gravid, soft, non-tender  Pelvic: Cervical exam deferred         Fetal Status:     Movement: Present    Fetal Surveillance Testing today: BPP 8/8   Results for orders placed or performed in visit on 04/13/18 (from the past 24 hour(s))  POC Urinalysis Dipstick OB   Collection Time: 04/13/18  3:04 PM  Result Value Ref Range   Color, UA     Clarity, UA     Glucose, UA Negative (A) (none)   Bilirubin, UA     Ketones, UA neg    Spec Grav, UA  1.010 - 1.025   Blood, UA neg    pH, UA  5.0 - 8.0   POC Protein UA Negative Negative, Trace   Urobilinogen, UA  0.2 or 1.0 E.U./dL   Nitrite, UA neg    Leukocytes, UA Negative Negative   Appearance     Odor      Assessment & Plan:  1) High-risk pregnancy G2P1001 at 208w6d with an Estimated Date of Delivery: 05/19/18   2) FGR, unstable, EFW 3.7% with elevated Doppler ratios 96%    Meds: No orders of the defined types were placed in this encounter.   Labs/procedures today: sonogram BPP 8/8  Treatment Plan:  IOL 37 weeks or as clinically indicated  Reviewed: Preterm labor symptoms and general obstetric precautions including but not limited to vaginal bleeding, contractions, leaking of fluid and fetal movement were reviewed in detail with the patient.  All questions were answered.  Follow-up: Return in about 3 days (around 04/16/2018) for NST, HROB.  Orders Placed This Encounter  Procedures  . POC Urinalysis Dipstick OB   Amaryllis DykeLuther H Eure  04/13/2018 4:11 PM

## 2018-04-16 ENCOUNTER — Ambulatory Visit (INDEPENDENT_AMBULATORY_CARE_PROVIDER_SITE_OTHER): Payer: BLUE CROSS/BLUE SHIELD | Admitting: Obstetrics and Gynecology

## 2018-04-16 ENCOUNTER — Encounter: Payer: Self-pay | Admitting: Obstetrics and Gynecology

## 2018-04-16 VITALS — BP 96/57 | HR 76 | Wt 175.8 lb

## 2018-04-16 DIAGNOSIS — Z1389 Encounter for screening for other disorder: Secondary | ICD-10-CM

## 2018-04-16 DIAGNOSIS — O0993 Supervision of high risk pregnancy, unspecified, third trimester: Secondary | ICD-10-CM

## 2018-04-16 DIAGNOSIS — Z3A35 35 weeks gestation of pregnancy: Secondary | ICD-10-CM | POA: Diagnosis not present

## 2018-04-16 DIAGNOSIS — O365930 Maternal care for other known or suspected poor fetal growth, third trimester, not applicable or unspecified: Secondary | ICD-10-CM | POA: Diagnosis not present

## 2018-04-16 DIAGNOSIS — Z331 Pregnant state, incidental: Secondary | ICD-10-CM

## 2018-04-16 LAB — POCT URINALYSIS DIPSTICK OB
Blood, UA: NEGATIVE
Glucose, UA: NEGATIVE — AB
KETONES UA: NEGATIVE
LEUKOCYTES UA: NEGATIVE
NITRITE UA: NEGATIVE
PROTEIN: NEGATIVE

## 2018-04-16 NOTE — Progress Notes (Signed)
Patient ID: Caroline Barnes, female   DOB: 02/16/1981, 37 y.o.   MRN: [id number]   [facility] Patient name: Caroline Barnes MRN [id number]  Date of birth: 08/14/1981 Chief Complaint:   High Risk Gestation (NST/ room # 8)  History of Present Illness:   Caroline Barnes is a 37 y.o. 702P1001 female at 4559w2d with an Estimated Date of Delivery: 05/19/18 being seen today for ongoing management of a high-risk pregnancy complicated by FGR with elevated Dopplers, planning IOL at 37 w.  FGR 3.7 % at last u/s 2/23 Today she reports no complaints. Contractions: Not present.  .  Movement: Present. denies leaking of fluid.  Review of Systems:   Pertinent items are noted in HPI Denies abnormal vaginal discharge w/ itching/odor/irritation, headaches, visual changes, shortness of breath, chest pain, abdominal pain, severe nausea/vomiting, or problems with urination or bowel movements unless otherwise stated above. Pertinent History Reviewed:  Reviewed past medical,surgical, social, obstetrical and family history.  Reviewed problem list, medications and allergies. Physical Assessment:   Vitals:   04/16/18 1200  BP: (!) 96/57  Pulse: 76  Weight: 175 lb 12.8 oz (79.7 kg)  Body mass index is 28.37 kg/m.           Physical Examination:   General appearance: alert, well appearing, and in no distress  Mental status: alert, oriented to person, place, and time, normal mood, behavior, speech, dress, motor activity, and thought processes, affect appropriate to mood  Skin: warm & dry   Extremities: Edema: None    Cardiovascular: normal heart rate noted  Respiratory: normal respiratory effort, no distress  Abdomen: gravid, soft, non-tender  Pelvic: Cervical exam deferred         Fetal Status: Fetal Heart Rate (bpm): 120-140 NST Fundal Height: 31 cm Movement: Present    Fetal Surveillance Testing today: NST reactive  Results for orders placed or performed in visit on  04/16/18 (from the past 24 hour(s))  POC Urinalysis Dipstick OB   Collection Time: 04/16/18 12:04 PM  Result Value Ref Range   Color, UA     Clarity, UA     Glucose, UA Negative (A) (none)   Bilirubin, UA     Ketones, UA neg    Spec Grav, UA  1.010 - 1.025   Blood, UA neg    pH, UA  5.0 - 8.0   POC Protein UA Negative Negative, Trace   Urobilinogen, UA  0.2 or 1.0 E.U./dL   Nitrite, UA neg    Leukocytes, UA Negative Negative   Appearance     Odor      Assessment & Plan:  1) High-risk pregnancy G2P1001 at 8359w2d with an Estimated Date of Delivery: 05/19/18   2) FGR, stable  Meds: No orders of the defined types were placed in this encounter.  Labs/procedures today: NST  Treatment Plan:  Induction at 37 weeks or as clinically indicated  Follow-up: Return in about 3 days (around 04/19/2018) for As Sched, HROB, U/S: Doppler.4 days  Orders Placed This Encounter  Procedures  . POC Urinalysis Dipstick OB   By signing my name below, I, Diona BrownerJennifer Gorman, attest that this documentation has been prepared under the direction and in the presence of Tilda BurrowFerguson, Delane Wessinger V, MD. Electronically Signed: Diona BrownerJennifer Gorman, Medical Scribe. 04/16/18. 12:43 PM.  I personally performed the services described in this documentation, which was SCRIBED in my presence. The recorded information has been reviewed and considered accurate. It has been edited as necessary  during review. Jonnie Kind, MD

## 2018-04-19 ENCOUNTER — Other Ambulatory Visit: Payer: Self-pay | Admitting: Obstetrics and Gynecology

## 2018-04-19 DIAGNOSIS — O365932 Maternal care for other known or suspected poor fetal growth, third trimester, fetus 2: Secondary | ICD-10-CM

## 2018-04-20 ENCOUNTER — Ambulatory Visit (INDEPENDENT_AMBULATORY_CARE_PROVIDER_SITE_OTHER): Payer: BLUE CROSS/BLUE SHIELD | Admitting: Obstetrics & Gynecology

## 2018-04-20 ENCOUNTER — Other Ambulatory Visit: Payer: Self-pay

## 2018-04-20 ENCOUNTER — Ambulatory Visit (INDEPENDENT_AMBULATORY_CARE_PROVIDER_SITE_OTHER): Payer: BLUE CROSS/BLUE SHIELD

## 2018-04-20 ENCOUNTER — Encounter: Payer: Self-pay | Admitting: Obstetrics & Gynecology

## 2018-04-20 ENCOUNTER — Telehealth (HOSPITAL_COMMUNITY): Payer: Self-pay | Admitting: *Deleted

## 2018-04-20 VITALS — BP 95/52 | HR 70 | Wt 177.0 lb

## 2018-04-20 DIAGNOSIS — Z1389 Encounter for screening for other disorder: Secondary | ICD-10-CM

## 2018-04-20 DIAGNOSIS — Z3A35 35 weeks gestation of pregnancy: Secondary | ICD-10-CM

## 2018-04-20 DIAGNOSIS — O365932 Maternal care for other known or suspected poor fetal growth, third trimester, fetus 2: Secondary | ICD-10-CM

## 2018-04-20 DIAGNOSIS — O365930 Maternal care for other known or suspected poor fetal growth, third trimester, not applicable or unspecified: Secondary | ICD-10-CM

## 2018-04-20 DIAGNOSIS — Z331 Pregnant state, incidental: Secondary | ICD-10-CM

## 2018-04-20 DIAGNOSIS — O0990 Supervision of high risk pregnancy, unspecified, unspecified trimester: Secondary | ICD-10-CM

## 2018-04-20 DIAGNOSIS — O0993 Supervision of high risk pregnancy, unspecified, third trimester: Secondary | ICD-10-CM

## 2018-04-20 LAB — POCT URINALYSIS DIPSTICK OB
Blood, UA: NEGATIVE
Glucose, UA: NEGATIVE — AB
Ketones, UA: NEGATIVE
LEUKOCYTES UA: NEGATIVE
NITRITE UA: NEGATIVE

## 2018-04-20 MED ORDER — ONDANSETRON 8 MG PO TBDP: 8.0000 mg | ORAL_TABLET | Freq: Three times a day (TID) | ORAL | 1 refills | Status: DC | PRN

## 2018-04-20 NOTE — Telephone Encounter (Signed)
Preadmission screen  

## 2018-04-20 NOTE — Addendum Note (Signed)
Addended by: Lazaro ArmsEURE, LUTHER H on: 04/20/2018 05:19 PM   Modules accepted: Orders, SmartSet

## 2018-04-20 NOTE — Treatment Plan (Signed)
   Induction Assessment Scheduling Form: [facility]ax to [facility]:  [phone]  Caroline HinesLashonda P Barnes                                                                                   DOB:  03/14/1981                                                            MRN:  [id number]                                                                     Phone #:         [phone]                   Provider:  [facility]  GP:  G2P1001                                                            Estimated Date of Delivery: 05/19/18  Dating Criteria: LMP + 1st Trimester sonogram    Medical Indications for induction:  FGR with elevated Dopplers Admission Date/Time:  04/28/2018 @ 0730 Gestational age on admission:  3540w0d   Filed Weights   04/20/18 1556  Weight: 177 lb (80.3 kg)   HIV:  Non Reactive (06/06 0912) LKG:MWNUUVOGBS:pending    LTC   Method of induction(proposed):  cytotec   Scheduling Provider Signature:  Lazaro ArmsLuther H Eure, MD                                            Today's Date:  04/20/2018

## 2018-04-20 NOTE — Progress Notes (Signed)
US 35+6 wks,cephalic,BPP 8/8,elevated UAD w/diastolic flow,RI .75,.74=98%,afi 14 cm,anterior pl gr 2

## 2018-04-20 NOTE — Progress Notes (Signed)
   HIGH-RISK PREGNANCY VISIT Patient name: Creig HinesLashonda P Ambrosius MRN [id number]  Date of birth: 10/09/1980 Chief Complaint:   High Risk Gestation (u/s today)  History of Present Illness:   Creig HinesLashonda P Helvey is a 37 y.o. 652P1001 female at 3965w6d with an Estimated Date of Delivery: 05/19/18 being seen today for ongoing management of a high-risk pregnancy complicated by FGR with elevated Doppler studies.  Today she reports no complaints. Contractions: Not present. Vag. Bleeding: None.  Movement: Present. denies leaking of fluid.  Review of Systems:   Pertinent items are noted in HPI Denies abnormal vaginal discharge w/ itching/odor/irritation, headaches, visual changes, shortness of breath, chest pain, abdominal pain, severe nausea/vomiting, or problems with urination or bowel movements unless otherwise stated above. Pertinent History Reviewed:  Reviewed past medical,surgical, social, obstetrical and family history.  Reviewed problem list, medications and allergies. Physical Assessment:   Vitals:   04/20/18 1556  BP: (!) 95/52  Pulse: 70  Weight: 177 lb (80.3 kg)  Body mass index is 28.57 kg/m.           Physical Examination:   General appearance: alert, well appearing, and in no distress  Mental status: alert, oriented to person, place, and time  Skin: warm & dry   Extremities: Edema: None    Cardiovascular: normal heart rate noted  Respiratory: normal respiratory effort, no distress  Abdomen: gravid, soft, non-tender  Pelvic: Cervical exam deferred         Fetal Status:     Movement: Present    Fetal Surveillance Testing today: BPP 8/8   Results for orders placed or performed in visit on 04/20/18 (from the past 24 hour(s))  POC Urinalysis Dipstick OB   Collection Time: 04/20/18  4:04 PM  Result Value Ref Range   Color, UA     Clarity, UA     Glucose, UA Negative (A) (none)   Bilirubin, UA     Ketones, UA neg    Spec Grav, UA  1.010 - 1.025   Blood, UA neg    pH, UA   5.0 - 8.0   POC Protein UA Trace Negative, Trace   Urobilinogen, UA  0.2 or 1.0 E.U./dL   Nitrite, UA neg    Leukocytes, UA Negative Negative   Appearance     Odor      Assessment & Plan:  1) High-risk pregnancy G2P1001 at 7165w6d with an Estimated Date of Delivery: 05/19/18   2) FGR, unstable  3) Elevated Fetal doppler studies, unstable  Meds: No orders of the defined types were placed in this encounter.   Labs/procedures today: BPP 8/8  Treatment Plan:  IOL 04/28/2018 scheduled, keep Friday's appt  Reviewed: Term labor symptoms and general obstetric precautions including but not limited to vaginal bleeding, contractions, leaking of fluid and fetal movement were reviewed in detail with the patient.  All questions were answered.  Follow-up: No follow-ups on file.  Orders Placed This Encounter  Procedures  . POC Urinalysis Dipstick OB   Amaryllis DykeLuther H Andrej Spagnoli  04/20/2018 4:09 PM

## 2018-04-21 ENCOUNTER — Telehealth (HOSPITAL_COMMUNITY): Payer: Self-pay | Admitting: *Deleted

## 2018-04-21 ENCOUNTER — Encounter (HOSPITAL_COMMUNITY): Payer: Self-pay | Admitting: *Deleted

## 2018-04-21 NOTE — Telephone Encounter (Signed)
Preadmission screen  

## 2018-04-23 ENCOUNTER — Encounter: Payer: Self-pay | Admitting: Obstetrics and Gynecology

## 2018-04-23 ENCOUNTER — Ambulatory Visit (INDEPENDENT_AMBULATORY_CARE_PROVIDER_SITE_OTHER): Payer: BLUE CROSS/BLUE SHIELD | Admitting: Obstetrics and Gynecology

## 2018-04-23 VITALS — BP 105/65 | HR 75 | Wt 176.0 lb

## 2018-04-23 DIAGNOSIS — Z3A36 36 weeks gestation of pregnancy: Secondary | ICD-10-CM

## 2018-04-23 DIAGNOSIS — O365910 Maternal care for other known or suspected poor fetal growth, first trimester, not applicable or unspecified: Secondary | ICD-10-CM

## 2018-04-23 DIAGNOSIS — O365930 Maternal care for other known or suspected poor fetal growth, third trimester, not applicable or unspecified: Secondary | ICD-10-CM

## 2018-04-23 DIAGNOSIS — Z331 Pregnant state, incidental: Secondary | ICD-10-CM

## 2018-04-23 DIAGNOSIS — O0993 Supervision of high risk pregnancy, unspecified, third trimester: Secondary | ICD-10-CM

## 2018-04-23 DIAGNOSIS — Z1389 Encounter for screening for other disorder: Secondary | ICD-10-CM

## 2018-04-23 LAB — POCT URINALYSIS DIPSTICK OB
Glucose, UA: NEGATIVE — AB
Ketones, UA: NEGATIVE
Leukocytes, UA: NEGATIVE
NITRITE UA: NEGATIVE
RBC UA: NEGATIVE

## 2018-04-23 NOTE — Progress Notes (Signed)
Patient ID: Caroline Barnes, female   DOB: 03/19/1981, 37 y.o.   MRN: [id number]    [facility] Patient name: Caroline Barnes MRN [id number]  Date of birth: 11/27/1980 Chief Complaint:   high risk ob (NST/ room# 8)  History of Present Illness:   Caroline Barnes is a 37 y.o. 82P1001 female at 357w2d with an Estimated Date of Delivery: 05/19/18 being seen today for ongoing management of a high-risk pregnancy complicated by FGR w/ high Dopplers.  Today she reports no complaints. The patient denies fever, chills or any other symptoms or complaints at this time.    Contractions: Not present.  .  Movement: Present. denies leaking of fluid.  Review of Systems:   Pertinent items are noted in HPI Denies abnormal vaginal discharge w/ itching/odor/irritation, headaches, visual changes, shortness of breath, chest pain, abdominal pain, severe nausea/vomiting, or problems with urination or bowel movements unless otherwise stated above. Pertinent History Reviewed:  Reviewed past medical,surgical, social, obstetrical and family history.  Reviewed problem list, medications and allergies. Physical Assessment:   Vitals:   04/23/18 1153  BP: 105/65  Pulse: 75  Weight: 176 lb (79.8 kg)  Body mass index is 28.41 kg/m.           Physical Examination:   General appearance: alert, well appearing, and in no distress and oriented to person, place, and time  Mental status: alert, oriented to person, place, and time, normal mood, behavior, speech, dress, motor activity, and thought processes, affect appropriate to mood  Skin: warm & dry   Extremities:      Cardiovascular: normal heart rate noted  Respiratory: normal respiratory effort, no distress  Abdomen: gravid, soft, non-tender  Pelvic: Cervical exam deferred         Fetal Status: Fetal Heart Rate (bpm): 130 NST Fundal Height: 33 cm Movement: Present    FHR: 130 w/ accels to 155  Fetal Surveillance Testing today: NST  reactive  Results for orders placed or performed in visit on 04/23/18 (from the past 24 hour(s))  POC Urinalysis Dipstick OB   Collection Time: 04/23/18 11:57 AM  Result Value Ref Range   Color, UA     Clarity, UA     Glucose, UA Negative (A) (none)   Bilirubin, UA     Ketones, UA neg    Spec Grav, UA  1.010 - 1.025   Blood, UA neg    pH, UA  5.0 - 8.0   POC Protein UA Trace Negative, Trace   Urobilinogen, UA  0.2 or 1.0 E.U./dL   Nitrite, UA neg    Leukocytes, UA Negative Negative   Appearance     Odor      Assessment & Plan:  1) High-risk pregnancy G2P1001 at 3457w2d with an Estimated Date of Delivery: 05/19/18   2) FGR with high Dopplers, stable for INDUCTION OF LABOR AT 37WK PREVIOUSLY SCHEDULED FOR 04/28/18 Meds: No orders of the defined types were placed in this encounter.  Labs/procedures today: none  Treatment Plan:  Induction at 37 weeks or as clinically indicated F/U 4 weeks for PP visit  Follow-up: Return in about 1 month (around 05/21/2018) for Postpartum chk.  Orders Placed This Encounter  Procedures  . POC Urinalysis Dipstick OB   By signing my name below, I, Pietro CassisEmily Tufford, attest that this documentation has been prepared under the direction and in the presence of Tilda BurrowFerguson, Tayten Bergdoll V, MD. Electronically Signed: Pietro CassisEmily Tufford, Medical Scribe. 04/23/18. 12:25 PM.  I personally  performed the services described in this documentation, which was SCRIBED in my presence. The recorded information has been reviewed and considered accurate. It has been edited as necessary during review. Tilda Burrow, MD

## 2018-04-27 ENCOUNTER — Other Ambulatory Visit: Payer: BLUE CROSS/BLUE SHIELD

## 2018-04-27 ENCOUNTER — Encounter: Payer: BLUE CROSS/BLUE SHIELD | Admitting: Obstetrics & Gynecology

## 2018-04-28 ENCOUNTER — Encounter (HOSPITAL_COMMUNITY): Payer: Self-pay

## 2018-04-28 ENCOUNTER — Inpatient Hospital Stay (HOSPITAL_COMMUNITY): Payer: BLUE CROSS/BLUE SHIELD | Admitting: Anesthesiology

## 2018-04-28 ENCOUNTER — Inpatient Hospital Stay (HOSPITAL_COMMUNITY)
Admission: RE | Admit: 2018-04-28 | Discharge: 2018-05-01 | DRG: 806 | Disposition: A | Discharge status: Home / Self Care | Payer: BLUE CROSS/BLUE SHIELD | Attending: Obstetrics & Gynecology | Admitting: Obstetrics & Gynecology

## 2018-04-28 DIAGNOSIS — O4103X0 Oligohydramnios, third trimester, not applicable or unspecified: Secondary | ICD-10-CM | POA: Diagnosis present

## 2018-04-28 DIAGNOSIS — O365930 Maternal care for other known or suspected poor fetal growth, third trimester, not applicable or unspecified: Principal | ICD-10-CM | POA: Diagnosis present

## 2018-04-28 DIAGNOSIS — Z3A00 Weeks of gestation of pregnancy not specified: Secondary | ICD-10-CM | POA: Diagnosis not present

## 2018-04-28 DIAGNOSIS — O09899 Supervision of other high risk pregnancies, unspecified trimester: Secondary | ICD-10-CM

## 2018-04-28 DIAGNOSIS — Z3A37 37 weeks gestation of pregnancy: Secondary | ICD-10-CM | POA: Diagnosis not present

## 2018-04-28 DIAGNOSIS — D649 Anemia, unspecified: Secondary | ICD-10-CM | POA: Diagnosis present

## 2018-04-28 DIAGNOSIS — O4100X0 Oligohydramnios, unspecified trimester, not applicable or unspecified: Secondary | ICD-10-CM

## 2018-04-28 DIAGNOSIS — F17210 Nicotine dependence, cigarettes, uncomplicated: Secondary | ICD-10-CM | POA: Diagnosis present

## 2018-04-28 DIAGNOSIS — O99334 Smoking (tobacco) complicating childbirth: Secondary | ICD-10-CM | POA: Diagnosis present

## 2018-04-28 DIAGNOSIS — O9902 Anemia complicating childbirth: Secondary | ICD-10-CM | POA: Diagnosis present

## 2018-04-28 DIAGNOSIS — O365990 Maternal care for other known or suspected poor fetal growth, unspecified trimester, not applicable or unspecified: Secondary | ICD-10-CM | POA: Diagnosis present

## 2018-04-28 DIAGNOSIS — O411230 Chorioamnionitis, third trimester, not applicable or unspecified: Secondary | ICD-10-CM | POA: Diagnosis not present

## 2018-04-28 LAB — CBC
HEMATOCRIT: 34.3 % — AB (ref 36.0–46.0)
HEMOGLOBIN: 11.5 g/dL — AB (ref 12.0–15.0)
MCH: 27.3 pg (ref 26.0–34.0)
MCHC: 33.5 g/dL (ref 30.0–36.0)
MCV: 81.5 fL (ref 78.0–100.0)
Platelets: 237 10*3/uL (ref 150–400)
RBC: 4.21 MIL/uL (ref 3.87–5.11)
RDW: 13.6 % (ref 11.5–15.5)
WBC: 5.9 10*3/uL (ref 4.0–10.5)

## 2018-04-28 LAB — TYPE AND SCREEN
ABO/RH(D): B POS
ANTIBODY SCREEN: NEGATIVE

## 2018-04-28 LAB — GROUP B STREP BY PCR: Group B strep by PCR: NEGATIVE

## 2018-04-28 MED ORDER — PHENYLEPHRINE 40 MCG/ML (10ML) SYRINGE FOR IV PUSH (FOR BLOOD PRESSURE SUPPORT)
80.0000 ug | PREFILLED_SYRINGE | INTRAVENOUS | Status: DC | PRN
  Filled 2018-04-28: qty 5
  Filled 2018-04-28: qty 10

## 2018-04-28 MED ORDER — FENTANYL 2.5 MCG/ML BUPIVACAINE 1/10 % EPIDURAL INFUSION (WH - ANES)
14.0000 mL/h | INTRAMUSCULAR | Status: DC | PRN
  Administered 2018-04-28: 14 mL/h via EPIDURAL
  Filled 2018-04-28: qty 100

## 2018-04-28 MED ORDER — EPHEDRINE 5 MG/ML INJ
10.0000 mg | INTRAVENOUS | Status: DC | PRN
  Filled 2018-04-28: qty 2

## 2018-04-28 MED ORDER — LIDOCAINE HCL (PF) 1 % IJ SOLN
30.0000 mL | INTRAMUSCULAR | Status: DC | PRN
  Filled 2018-04-28: qty 30

## 2018-04-28 MED ORDER — LACTATED RINGERS IV SOLN
INTRAVENOUS | Status: DC
  Administered 2018-04-28: 10:00:00 via INTRAVENOUS

## 2018-04-28 MED ORDER — FENTANYL CITRATE (PF) 100 MCG/2ML IJ SOLN
100.0000 ug | INTRAMUSCULAR | Status: DC | PRN
  Administered 2018-04-28 (×3): 100 ug via INTRAVENOUS
  Filled 2018-04-28 (×3): qty 2

## 2018-04-28 MED ORDER — NICOTINE 7 MG/24HR TD PT24
7.0000 mg | MEDICATED_PATCH | Freq: Every day | TRANSDERMAL | Status: DC
  Administered 2018-04-28: 7 mg via TRANSDERMAL
  Filled 2018-04-28 (×2): qty 1

## 2018-04-28 MED ORDER — DIPHENHYDRAMINE HCL 50 MG/ML IJ SOLN: 12.5000 mg | INTRAMUSCULAR | Status: DC | PRN

## 2018-04-28 MED ORDER — OXYTOCIN 40 UNITS IN LACTATED RINGERS INFUSION - SIMPLE MED: 2.5000 [IU]/h | INTRAVENOUS | Status: DC

## 2018-04-28 MED ORDER — OXYTOCIN 40 UNITS IN LACTATED RINGERS INFUSION - SIMPLE MED
1.0000 m[IU]/min | INTRAVENOUS | Status: DC
  Filled 2018-04-28: qty 1000

## 2018-04-28 MED ORDER — SOD CITRATE-CITRIC ACID 500-334 MG/5ML PO SOLN
30.0000 mL | ORAL | Status: DC | PRN
  Administered 2018-04-28: 30 mL via ORAL
  Filled 2018-04-28 (×2): qty 15

## 2018-04-28 MED ORDER — ACETAMINOPHEN 325 MG PO TABS: 650.0000 mg | ORAL_TABLET | ORAL | Status: DC | PRN

## 2018-04-28 MED ORDER — PHENYLEPHRINE 40 MCG/ML (10ML) SYRINGE FOR IV PUSH (FOR BLOOD PRESSURE SUPPORT)
80.0000 ug | PREFILLED_SYRINGE | INTRAVENOUS | Status: DC | PRN
  Filled 2018-04-28: qty 5

## 2018-04-28 MED ORDER — ONDANSETRON HCL 4 MG/2ML IJ SOLN: 4.0000 mg | Freq: Four times a day (QID) | INTRAMUSCULAR | Status: DC | PRN

## 2018-04-28 MED ORDER — FENTANYL CITRATE (PF) 100 MCG/2ML IJ SOLN
INTRAMUSCULAR | Status: AC
  Administered 2018-04-28: 100 ug
  Filled 2018-04-28: qty 2

## 2018-04-28 MED ORDER — LIDOCAINE HCL (PF) 1 % IJ SOLN
INTRAMUSCULAR | Status: DC | PRN
  Administered 2018-04-28: 4 mL via EPIDURAL
  Administered 2018-04-28: 6 mL via EPIDURAL

## 2018-04-28 MED ORDER — LACTATED RINGERS IV SOLN
500.0000 mL | INTRAVENOUS | Status: DC | PRN
  Administered 2018-04-28 (×2): 500 mL via INTRAVENOUS

## 2018-04-28 MED ORDER — TERBUTALINE SULFATE 1 MG/ML IJ SOLN
0.2500 mg | Freq: Once | INTRAMUSCULAR | Status: AC | PRN
  Administered 2018-04-28: 0.25 mg via SUBCUTANEOUS
  Filled 2018-04-28: qty 1

## 2018-04-28 MED ORDER — OXYCODONE-ACETAMINOPHEN 5-325 MG PO TABS
2.0000 | ORAL_TABLET | ORAL | Status: DC | PRN
Start: 2018-04-28 — End: 2018-04-29

## 2018-04-28 MED ORDER — LACTATED RINGERS IV SOLN: 500.0000 mL | Freq: Once | INTRAVENOUS | Status: DC

## 2018-04-28 MED ORDER — FENTANYL CITRATE (PF) 100 MCG/2ML IJ SOLN: 100.0000 ug | Freq: Once | INTRAMUSCULAR | Status: DC

## 2018-04-28 MED ORDER — OXYCODONE-ACETAMINOPHEN 5-325 MG PO TABS: 1.0000 | ORAL_TABLET | ORAL | Status: DC | PRN

## 2018-04-28 MED ORDER — MISOPROSTOL 25 MCG QUARTER TABLET
25.0000 ug | ORAL_TABLET | ORAL | Status: DC | PRN
Start: 2018-04-28 — End: 2018-04-29
  Administered 2018-04-28: 25 ug via VAGINAL
  Filled 2018-04-28 (×3): qty 1

## 2018-04-28 MED ORDER — OXYTOCIN BOLUS FROM INFUSION
500.0000 mL | Freq: Once | INTRAVENOUS | Status: DC
  Administered 2018-04-29: 500 mL via INTRAVENOUS

## 2018-04-28 NOTE — Progress Notes (Signed)
Patient ID: Creig HinesLashonda P Genest, female   DOB: 05/17/1981, 37 y.o.   MRN: [id number]  Introduced self to patient.  Patient was having painful contractions every 2 minutes.  Verbalized desire for epidural.  Had questions about if it would and process was explained to her.  Otherwise patient has no complaints or symptoms.  Will continue to monitor patient's progress, but does not appear to need pitocin now.    FHT: 135 bpm.  Mod var. accels seen. Late decel seen at 2156.    BP 124/80   Pulse 80   Temp 97.6 F (36.4 C)   Resp 20   Ht 5\' 6"  (1.676 m)   Wt 79.5 kg (175 lb 3.2 oz)   LMP 08/12/2017 (Exact Date)   SpO2 100%   BMI 28.28 kg/m   Dilation: 4.5 Effacement (%): 60 Cervical Position: Posterior Station: -2 Presentation: Vertex Exam by:: A Ament RN

## 2018-04-28 NOTE — Anesthesia Pain Management Evaluation Note (Signed)
  CRNA Pain Management Visit Note  Patient: Caroline Barnes, 37 y.o., female  "Hello I am a [facility] the [facility]. We have an anesthesia team available at all times to provide care throughout the hospital, including epidural management and anesthesia for C-section. I don't know your plan for the delivery whether it a natural birth, water birth, IV sedation, nitrous supplementation, doula or epidural, but we want to meet your pain goals."   1.Was your pain managed to your expectations on prior hospitalizations?   Yes   2.What is your expectation for pain management during this hospitalization?     Labor support without medications, Epidural and IV pain meds  3.How can we help you reach that goal? Possible epidural  Record the patient's initial score and the patient's pain goal.   Pain: 0  Pain Goal: 5 [facility] wants you to be able to say your pain was always managed very well.  Caroline Barnes 04/28/2018

## 2018-04-28 NOTE — Progress Notes (Signed)
Patient ID: Caroline HinesLashonda P Hartgrove, female   DOB: 09/13/1981, 37 y.o.   MRN: [id number]  Patient was having repetitive decelerations every few minutes despite turns a fluid bolus and we were unable to identify contractions on the monitor so we ruptures patient's membranes (small amount of clear fluid) and inserted an IUPC and FSE. Amnioinfusion was done.  Fetal heart tacing improving s/p amnioinfusion. Will continue to monitor.  S/p foley bulb.  Not currntly on pitocin.  Has epidural.. MVU's are more than adequate.  approx 80 mvu w/ each contraction.    FHT: 135 bpm. Moderate variability.  Reactive.  There were several decelerations every 4-5 minutes.  S/p amnioinfusion there was one late decel and has since not had decels.    BP 102/64   Pulse 74   Temp 97.6 F (36.4 C)   Resp 20   Ht 5\' 6"  (1.676 m)   Wt 79.5 kg (175 lb 3.2 oz)   LMP 08/12/2017 (Exact Date)   SpO2 100%   BMI 28.28 kg/m   Dilation: 5 Effacement (%): 80 Cervical Position: (side) Station: -1 Presentation: Vertex Exam by:: Pincus BadderK Shaw

## 2018-04-28 NOTE — Anesthesia Preprocedure Evaluation (Signed)
Anesthesia Evaluation  Patient identified by MRN, date of birth, ID band Patient awake    Reviewed: Allergy & Precautions, NPO status , Patient's Chart, lab work & pertinent test results  History of Anesthesia Complications Negative for: history of anesthetic complications  Airway Mallampati: II  TM Distance: >3 FB Neck ROM: Full    Dental   Pulmonary Current Smoker,    breath sounds clear to auscultation       Cardiovascular negative cardio ROS   Rhythm:Regular Rate:Normal     Neuro/Psych negative neurological ROS  negative psych ROS   GI/Hepatic negative GI ROS, Neg liver ROS,   Endo/Other  negative endocrine ROS  Renal/GU negative Renal ROS  negative genitourinary   Musculoskeletal negative musculoskeletal ROS (+)   Abdominal   Peds  Hematology  (+) anemia ,   Anesthesia Other Findings   Reproductive/Obstetrics (+) Pregnancy                             Anesthesia Physical Anesthesia Plan  ASA: II  Anesthesia Plan: Epidural   Post-op Pain Management:    Induction:   PONV Risk Score and Plan: 2 and Treatment may vary due to age or medical condition  Airway Management Planned: Natural Airway  Additional Equipment: None  Intra-op Plan:   Post-operative Plan:   Informed Consent: I have reviewed the patients History and Physical, chart, labs and discussed the procedure including the risks, benefits and alternatives for the proposed anesthesia with the patient or authorized representative who has indicated his/her understanding and acceptance.     Plan Discussed with: Anesthesiologist  Anesthesia Plan Comments: (Labs reviewed. Platelets acceptable, patient not taking any blood thinning medications. Per RN, FHR tracing reported to be stable enough for sitting procedure. Risks and benefits discussed with patient, including PDPH, backache, epidural hematoma, failed epidural,  allergic reaction, and nerve injury. Patient expressed understanding and wished to proceed.)        Anesthesia Quick Evaluation

## 2018-04-28 NOTE — Anesthesia Procedure Notes (Signed)
Epidural Patient location during procedure: OB Start time: 04/28/2018 9:26 PM End time: 04/28/2018 9:30 PM  Staffing Anesthesiologist: Beryle LatheBrock, Felesia Stahlecker E, MD Performed: anesthesiologist   Preanesthetic Checklist Completed: patient identified, pre-op evaluation, timeout performed, IV checked, risks and benefits discussed and monitors and equipment checked  Epidural Patient position: sitting Prep: DuraPrep Patient monitoring: continuous pulse ox and blood pressure Approach: midline Location: L2-L3 Injection technique: LOR saline  Needle:  Needle type: Tuohy  Needle gauge: 17 G Needle length: 9 cm Needle insertion depth: 5 cm Catheter size: 19 Gauge Catheter at skin depth: 10 cm Test dose: negative and Other (1% lidocaine)  Additional Notes Patient identified. Risks including, but not limited to, bleeding, infection, nerve damage, paralysis, inadequate analgesia, blood pressure changes, nausea, vomiting, allergic reaction, postpartum back pain, itching, and headache were discussed. Patient expressed understanding and wished to proceed. Sterile prep and drape, including hand hygiene, mask, and sterile gloves were used. The patient was positioned and the spine was prepped. The skin was anesthetized with lidocaine. No paraesthesia or other complication noted. The patient did not experience any signs of intravascular injection such as tinnitus or metallic taste in mouth, nor signs of intrathecal spread such as rapid motor block. Please see nursing notes for vital signs. The patient tolerated the procedure well.   Leslye Peerhomas Deann Mclaine, MDReason for block:procedure for pain

## 2018-04-28 NOTE — Progress Notes (Signed)
Labor and Delivery Progress Note  36yo G2P1001 at 37w0 who presents for IOL for IUGR.   Labor: Induced, latent. Will continue to monitor and consider starting pitocin.  -- s/p cytotec x 1 -- FB placed at 1445  FWB: IUGR (EFW 1955g, 3.7%). Persistent variable decelerations after Cytotec - fluid bolus given and now improved.  -- continuous FHR - category I strip at this time  -- GBS (-)

## 2018-04-28 NOTE — H&P (Signed)
LABOR AND DELIVERY ADMISSION HISTORY AND PHYSICAL NOTE  Caroline Barnes is a 37 y.o. female G2P1001 with IUP at 8426w0d by LMP c/w 7 wk sono presenting for IOL for IUGR (EFW sono 7/23 1955g, 3.7% and elevated fetal dopplers consistent with diastolic flow)   She reports positive fetal movement. She denies leakage of fluid or vaginal bleeding.  Prenatal History/Complications: PNC at FT established at 9 wk.  Pregnancy complications:  - IUGR -short intervals between pregnancies  -tobacco use   Past Medical History: Past Medical History:  Diagnosis Date  . Cellulitis     Past Surgical History: Past Surgical History:  Procedure Laterality Date  . NO PAST SURGERIES      Obstetrical History: OB History    Gravida  2   Para  1   Term  1   Preterm  0   AB  0   Living  1     SAB  0   TAB  0   Ectopic  0   Multiple  0   Live Births  1           Social History: Social History   Socioeconomic History  . Marital status: Single    Spouse name: Not on file  . Number of children: Not on file  . Years of education: Not on file  . Highest education level: Not on file  Occupational History  . Not on file  Social Needs  . Financial resource strain: Not on file  . Food insecurity:    Worry: Not on file    Inability: Not on file  . Transportation needs:    Medical: Not on file    Non-medical: Not on file  Tobacco Use  . Smoking status: Current Every Day Smoker    Packs/day: 0.50    Types: Cigarettes  . Smokeless tobacco: Never Used  Substance and Sexual Activity  . Alcohol use: No    Frequency: Never    Comment: 2 or 3 days per week.  . Drug use: No  . Sexual activity: Yes    Birth control/protection: None  Lifestyle  . Physical activity:    Days per week: Not on file    Minutes per session: Not on file  . Stress: Not on file  Relationships  . Social connections:    Talks on phone: Not on file    Gets together: Not on file    Attends religious  service: Not on file    Active member of club or organization: Not on file    Attends meetings of clubs or organizations: Not on file    Relationship status: Not on file  Other Topics Concern  . Not on file  Social History Narrative  . Not on file    Family History: Family History  Problem Relation Age of Onset  . Rashes / Skin problems Cousin        Contact dermatitis  . Cancer Cousin        breast  . Thyroid cancer Mother   . Diabetes Mother   . Cancer Mother        breast  . Stroke Father   . Diabetes Maternal Aunt   . Diabetes Maternal Uncle   . Diabetes Paternal Aunt   . Stroke Paternal Aunt   . Diabetes Paternal Uncle   . Cancer Paternal Grandmother     Allergies: No Known Allergies  Medications Prior to Admission  Medication Sig Dispense Refill Last Dose  .  Calcium Carbonate Antacid (TUMS PO) Take by mouth as needed.   Taking  . ondansetron (ZOFRAN ODT) 8 MG disintegrating tablet Take 1 tablet (8 mg total) by mouth every 8 (eight) hours as needed for nausea or vomiting. 12 tablet 1 Taking  . terconazole (TERAZOL 7) 0.4 % vaginal cream Place 1 applicator vaginally at bedtime. (Patient not taking: Reported on 04/02/2018) 45 g 0 Not Taking     Review of Systems  All systems reviewed and negative except as stated in HPI  Physical Exam Blood pressure (!) 104/57, pulse (!) 57, temperature 98.2 F (36.8 C), temperature source Oral, resp. rate 18, height 5\' 6"  (1.676 m), weight 175 lb 3.2 oz (79.5 kg), last menstrual period 08/12/2017, not currently breastfeeding. General appearance: alert, oriented, NAD Lungs: normal respiratory effort Heart: regular rate Abdomen: soft, non-tender; gravid, FH appropriate for GA Extremities: No calf swelling or tenderness Presentation: cephalic Fetal monitoring: 135 bpm, moderate variability, +accels, variable decels with good recovery  Uterine activity: q1-5 min Dilation: Closed Effacement (%): Thick Station: -3 Exam by::  [employer]  Prenatal labs: ABO, Rh:  B+  Antibody: Negative (06/06 0912) Rubella: 8.46 (01/29 1655) RPR: Non Reactive (06/06 0912)  HBsAg: Negative (01/29 1655)  HIV: Non Reactive (06/06 0912)  GC/Chlamydia: Unknown  GBS:   Unknown  2-hr GTT: Normal  Genetic screening:  Declined  Anatomy US: Normal   Prenatal Transfer Tool  Maternal Diabetes: No Genetic Screening: Declined Maternal Ultrasounds/Referrals: Normal Fetal Ultrasounds or other Referrals:  Referred to Materal Fetal Medicine  Maternal Substance Abuse:  No Significant Maternal Medications:  None Significant Maternal Lab Results: None  Results for orders placed or performed during the hospital encounter of 04/28/18 (from the past 24 hour(s))  CBC   Collection Time: 04/28/18  9:19 AM  Result Value Ref Range   WBC 5.9 4.0 - 10.5 K/uL   RBC 4.21 3.87 - 5.11 MIL/uL   Hemoglobin 11.5 (L) 12.0 - 15.0 g/dL   HCT 95.6 (L) 21.3 - 08.6 %   MCV 81.5 78.0 - 100.0 fL   MCH 27.3 26.0 - 34.0 pg   MCHC 33.5 30.0 - 36.0 g/dL   RDW 57.8 46.9 - 62.9 %   Platelets 237 150 - 400 K/uL    Patient Active Problem List   Diagnosis Date Noted  . Supervision of high risk pregnancy, antepartum 10/20/2017  . Short interval between pregnancies affecting pregnancy, antepartum 10/20/2017  . IUGR (intrauterine growth restriction) affecting care of mother 01/29/2017  . Abnormal Pap smear of cervix 07/28/2016  . Smoker 07/28/2016  . Cellulitis of right leg 07/28/2015    Assessment: Caroline Barnes is a 37 y.o. G2P1001 at [redacted]w[redacted]d here for IOL for IUGR.   #Labor: Induction with cytotec.  #Pain: Epidural upon request.  #FWB: Cat II.  #ID:  GBS status unknown. No prophylaxis indicated at this point due to term status. Will check PCR. NKDA. Will check GC/Chlamydia as no result from 3rd trimester.  #MOF: Breast #MOC:Nexplanon. Had BTL papers signed but has changed mind.  #Circ:  Inpatient   De Hollingshead 04/28/2018, 10:11 AM

## 2018-04-29 ENCOUNTER — Other Ambulatory Visit: Payer: Self-pay

## 2018-04-29 ENCOUNTER — Encounter (HOSPITAL_COMMUNITY): Payer: Self-pay

## 2018-04-29 LAB — RPR: RPR: NONREACTIVE

## 2018-04-29 MED ORDER — BENZOCAINE-MENTHOL 20-0.5 % EX AERO
1.0000 "application " | INHALATION_SPRAY | CUTANEOUS | Status: DC | PRN
  Administered 2018-04-29: 1 via TOPICAL
  Filled 2018-04-29: qty 56

## 2018-04-29 MED ORDER — ONDANSETRON HCL 4 MG PO TABS: 4.0000 mg | ORAL_TABLET | ORAL | Status: DC | PRN

## 2018-04-29 MED ORDER — DIPHENHYDRAMINE HCL 25 MG PO CAPS: 25.0000 mg | ORAL_CAPSULE | Freq: Four times a day (QID) | ORAL | Status: DC | PRN

## 2018-04-29 MED ORDER — ACETAMINOPHEN 325 MG PO TABS
650.0000 mg | ORAL_TABLET | ORAL | Status: DC | PRN
  Administered 2018-04-29 (×2): 650 mg via ORAL
  Filled 2018-04-29 (×2): qty 2

## 2018-04-29 MED ORDER — IBUPROFEN 600 MG PO TABS
600.0000 mg | ORAL_TABLET | Freq: Four times a day (QID) | ORAL | Status: DC
  Administered 2018-04-29 – 2018-05-01 (×10): 600 mg via ORAL
  Filled 2018-04-29 (×10): qty 1

## 2018-04-29 MED ORDER — DIBUCAINE 1 % RE OINT: 1.0000 "application " | TOPICAL_OINTMENT | RECTAL | Status: DC | PRN

## 2018-04-29 MED ORDER — TETANUS-DIPHTH-ACELL PERTUSSIS 5-2.5-18.5 LF-MCG/0.5 IM SUSP: 0.5000 mL | Freq: Once | INTRAMUSCULAR | Status: DC

## 2018-04-29 MED ORDER — ONDANSETRON HCL 4 MG/2ML IJ SOLN: 4.0000 mg | INTRAMUSCULAR | Status: DC | PRN

## 2018-04-29 MED ORDER — SIMETHICONE 80 MG PO CHEW: 80.0000 mg | CHEWABLE_TABLET | ORAL | Status: DC | PRN

## 2018-04-29 MED ORDER — PRENATAL MULTIVITAMIN CH
1.0000 | ORAL_TABLET | Freq: Every day | ORAL | Status: DC
  Administered 2018-04-29: 1 via ORAL
  Filled 2018-04-29 (×2): qty 1

## 2018-04-29 MED ORDER — ZOLPIDEM TARTRATE 5 MG PO TABS: 5.0000 mg | ORAL_TABLET | Freq: Every evening | ORAL | Status: DC | PRN

## 2018-04-29 MED ORDER — WITCH HAZEL-GLYCERIN EX PADS: 1.0000 "application " | MEDICATED_PAD | CUTANEOUS | Status: DC | PRN

## 2018-04-29 MED ORDER — SENNOSIDES-DOCUSATE SODIUM 8.6-50 MG PO TABS
2.0000 | ORAL_TABLET | ORAL | Status: DC
  Administered 2018-04-29 – 2018-05-01 (×2): 2 via ORAL
  Filled 2018-04-29 (×2): qty 2

## 2018-04-29 MED ORDER — COCONUT OIL OIL: 1.0000 "application " | TOPICAL_OIL | Status: DC | PRN

## 2018-04-29 NOTE — Plan of Care (Signed)
Pt. Condition will continue to improve 

## 2018-04-29 NOTE — Lactation Note (Signed)
This note was copied from a baby's chart. Lactation Consultation Note  Patient Name: Caroline Barnes NFAOZ'HToday's Date: 04/29/2018 Reason for consult: Initial assessment;1st time breastfeeding;NICU baby;Early term 37-38.6wks;Infant < 6lbs  Visited with P2 Mom of ET infant in the NICU.  Baby 13 hrs old, and Mom has pumped twice.  Set up bins for washing and drying of pump parts and demonstrated how to wash parts each time.   Encouraged Mom to pump>8 times per 24 hrs.  Reviewed breast massage and hand expression and encouraged Mom to do this prior to and after pumping.  NICU booklet given to Mom.  Lactation brochure left with Mom.  [place] them to let them know she delivered her baby in the NICU.  [facility] letting [facility] know Mom will need a DEBP.  [facility]  To call for assistance as needed.   Interventions Interventions: Skin to skin;Breast massage;Hand express;DEBP  Lactation Tools Discussed/Used Tools: Pump Breast pump type: Double-Electric Breast Pump WIC Program: Yes Pump Review: Setup, frequency, and cleaning;Milk Storage Initiated by:: RN Date initiated:: 04/29/18   Consult Status Consult Status: Follow-up Date: 04/30/18 Follow-up type: In-patient    [name], [name] 04/29/2018, 2:12 PM

## 2018-04-29 NOTE — Anesthesia Postprocedure Evaluation (Signed)
Anesthesia Post Note  Patient: Caroline Barnes  Procedure(s) Performed: AN AD HOC LABOR EPIDURAL     Patient location during evaluation: Mother Baby Anesthesia Type: Epidural Level of consciousness: awake and alert and oriented Pain management: satisfactory to patient Vital Signs Assessment: post-procedure vital signs reviewed and stable Respiratory status: spontaneous breathing and nonlabored ventilation Cardiovascular status: stable Postop Assessment: no headache, no backache, no signs of nausea or vomiting, adequate PO intake and patient able to bend at knees (patient up walking) Anesthetic complications: no    Last Vitals:  Vitals:   04/29/18 0359 04/29/18 0800  BP: 130/78 106/68  Pulse: 69 65  Resp: 18 18  Temp: 37.2 C 36.9 C  SpO2: 100% 99%    Last Pain:  Vitals:   04/29/18 0800  TempSrc: Oral  PainSc:    Pain Goal: Patients Stated Pain Goal: 4 (04/28/18 2345)               Madison HickmanGREGORY,Rebekah Zackery

## 2018-04-30 ENCOUNTER — Other Ambulatory Visit: Payer: BLUE CROSS/BLUE SHIELD | Admitting: Obstetrics & Gynecology

## 2018-04-30 NOTE — Progress Notes (Signed)
CSW acknowledges consult and completed clinical assessment.  Clinical documentation will follow.  There are no barriers to d/c.  Caroline Barnes, MSW, LCSW Clinical Social Work [phone]   

## 2018-04-30 NOTE — Progress Notes (Signed)
Post Partum Day 1 Subjective: no complaints, up ad lib, voiding, tolerating PO and + flatus  Objective: Blood pressure 121/85, pulse 61, temperature 98.7 F (37.1 C), temperature source Oral, resp. rate 20, height 5\' 6"  (1.676 m), weight 79.5 kg, last menstrual period 08/12/2017, SpO2 98 %, unknown if currently breastfeeding.  Physical Exam:  General: alert, cooperative and no distress Lochia: appropriate Uterine Fundus: firm DVT Evaluation: No evidence of DVT seen on physical exam.  Recent Labs    04/28/18 0919  HGB 11.5*  HCT 34.3*    Assessment/Plan: Breastfeeding, pumping, baby in NICU   LOS: 2 days   Scheryl DarterJames Arnold 04/30/2018, 12:47 PM

## 2018-05-01 DIAGNOSIS — O4100X0 Oligohydramnios, unspecified trimester, not applicable or unspecified: Secondary | ICD-10-CM

## 2018-05-01 MED ORDER — IBUPROFEN 600 MG PO TABS: 600.0000 mg | ORAL_TABLET | Freq: Four times a day (QID) | ORAL | 0 refills | Status: DC

## 2018-05-01 NOTE — Discharge Instructions (Signed)
Vaginal Delivery, Care After °Refer to this sheet in the next few weeks. These instructions provide you with information about caring for yourself after vaginal delivery. Your health care provider may also give you more specific instructions. Your treatment has been planned according to current medical practices, but problems sometimes occur. Call your health care provider if you have any problems or questions. °What can I expect after the procedure? °After vaginal delivery, it is common to have: °· Some bleeding from your vagina. °· Soreness in your abdomen, your vagina, and the area of skin between your vaginal opening and your anus (perineum). °· Pelvic cramps. °· Fatigue. ° °Follow these instructions at home: °Medicines °· Take over-the-counter and prescription medicines only as told by your health care provider. °· If you were prescribed an antibiotic medicine, take it as told by your health care provider. Do not stop taking the antibiotic until it is finished. °Driving ° °· Do not drive or operate heavy machinery while taking prescription pain medicine. °· Do not drive for 24 hours if you received a sedative. °Lifestyle °· Do not drink alcohol. This is especially important if you are breastfeeding or taking medicine to relieve pain. °· Do not use tobacco products, including cigarettes, chewing tobacco, or e-cigarettes. If you need help quitting, ask your health care provider. °Eating and drinking °· Drink at least 8 eight-ounce glasses of water every day unless you are told not to by your health care provider. If you choose to breastfeed your baby, you may need to drink more water than this. °· Eat high-fiber foods every day. These foods may help prevent or relieve constipation. High-fiber foods include: °? Whole grain cereals and breads. °? Brown rice. °? Beans. °? Fresh fruits and vegetables. °Activity °· Return to your normal activities as told by your health care provider. Ask your health care provider  what activities are safe for you. °· Rest as much as possible. Try to rest or take a nap when your baby is sleeping. °· Do not lift anything that is heavier than your baby or 10 lb (4.5 kg) until your health care provider says that it is safe. °· Talk with your health care provider about when you can engage in sexual activity. This may depend on your: °? Risk of infection. °? Rate of healing. °? Comfort and desire to engage in sexual activity. °Vaginal Care °· If you have an episiotomy or a vaginal tear, check the area every day for signs of infection. Check for: °? More redness, swelling, or pain. °? More fluid or blood. °? Warmth. °? Pus or a bad smell. °· Do not use tampons or douches until your health care provider says this is safe. °· Watch for any blood clots that may pass from your vagina. These may look like clumps of dark red, brown, or black discharge. °General instructions °· Keep your perineum clean and dry as told by your health care provider. °· Wear loose, comfortable clothing. °· Wipe from front to back when you use the toilet. °· Ask your health care provider if you can shower or take a bath. If you had an episiotomy or a perineal tear during labor and delivery, your health care provider may tell you not to take baths for a certain length of time. °· Wear a bra that supports your breasts and fits you well. °· If possible, have someone help you with household activities and help care for your baby for at least a few days after   you leave the hospital. °· Keep all follow-up visits for you and your baby as told by your health care provider. This is important. °Contact a health care provider if: °· You have: °? Vaginal discharge that has a bad smell. °? Difficulty urinating. °? Pain when urinating. °? A sudden increase or decrease in the frequency of your bowel movements. °? More redness, swelling, or pain around your episiotomy or vaginal tear. °? More fluid or blood coming from your episiotomy or  vaginal tear. °? Pus or a bad smell coming from your episiotomy or vaginal tear. °? A fever. °? A rash. °? Little or no interest in activities you used to enjoy. °? Questions about caring for yourself or your baby. °· Your episiotomy or vaginal tear feels warm to the touch. °· Your episiotomy or vaginal tear is separating or does not appear to be healing. °· Your breasts are painful, hard, or turn red. °· You feel unusually sad or worried. °· You feel nauseous or you vomit. °· You pass large blood clots from your vagina. If you pass a blood clot from your vagina, save it to show to your health care provider. Do not flush blood clots down the toilet without having your health care provider look at them. °· You urinate more than usual. °· You are dizzy or light-headed. °· You have not breastfed at all and you have not had a menstrual period for 12 weeks after delivery. °· You have stopped breastfeeding and you have not had a menstrual period for 12 weeks after you stopped breastfeeding. °Get help right away if: °· You have: °? Pain that does not go away or does not get better with medicine. °? Chest pain. °? Difficulty breathing. °? Blurred vision or spots in your vision. °? Thoughts about hurting yourself or your baby. °· You develop pain in your abdomen or in one of your legs. °· You develop a severe headache. °· You faint. °· You bleed from your vagina so much that you fill two sanitary pads in one hour. °This information is not intended to replace advice given to you by your health care provider. Make sure you discuss any questions you have with your health care provider. °Document Released: 09/05/2000 Document Revised: 02/20/2016 Document Reviewed: 09/23/2015 °Elsevier Interactive Patient Education © 2018 [employer] ° °

## 2018-05-01 NOTE — Discharge Summary (Signed)
OB Discharge Summary     Patient Name: Caroline Barnes DOB: 09/20/1981 MRN: [id number]  Date of admission: 04/28/2018 Delivering MD: Lenor CoffinLSON, DANIEL K   Date of discharge: 05/01/2018  Admitting diagnosis: INDUCTION Intrauterine pregnancy: 6571w1d     Secondary diagnosis:  Active Problems:   IUGR (intrauterine growth restriction) affecting care of mother   SVD (spontaneous vaginal delivery)  Additional problems: IUGR, oligohydramnios     Discharge diagnosis: Term Pregnancy Delivered                                                                                                Post partum procedures:None  Augmentation: AROM and Foley Balloon  Complications: None  Hospital course:  Induction of Labor With Vaginal Delivery   37 y.o. yo G2P2002 at 6671w1d was admitted to the hospital 04/28/2018 for induction of labor.  Indication for induction: IUGR, oligohydramnios.  Patient had an uncomplicated labor course as follows: Membrane Rupture Time/Date: 11:30 PM ,04/28/2018   Intrapartum Procedures: Episiotomy: None [1]                                         Lacerations:  Vaginal [6]  Patient had delivery of a Viable infant.  Information for the patient's newborn:  Caroline Barnes, Boy Caroline Barnes [[id number]][[id number]]  Delivery Method: Vaginal, Spontaneous(Filed from Delivery Summary)   04/29/2018  Details of delivery can be found in separate delivery note.  Patient had a routine postpartum course. Patient is discharged home 05/01/18.  Physical exam  Vitals:   04/30/18 1643 04/30/18 1921 05/01/18 0548 05/01/18 0758  BP: 127/80 124/80 133/64 135/73  Pulse: 77 64 (!) 52 63  Resp: 19 18 16 16   Temp: 98.7 F (37.1 C) 98.8 F (37.1 C) 98.9 F (37.2 C) 98.5 F (36.9 C)  TempSrc: Oral Oral Oral Oral  SpO2: 100% 100% 100% 100%  Weight:      Height:       General: alert Lochia: appropriate Uterine Fundus: firm DVT Evaluation: No evidence of DVT seen on physical exam.  Labs: Lab Results   Component Value Date   WBC 5.9 04/28/2018   HGB 11.5 (L) 04/28/2018   HCT 34.3 (L) 04/28/2018   MCV 81.5 04/28/2018   PLT 237 04/28/2018   CMP Latest Ref Rng & Units 06/23/2017  Glucose 65 - 99 mg/dL 99  BUN 6 - 20 mg/dL 15  Creatinine 2.130.44 - 0.861.00 mg/dL 5.780.78  Sodium 469135 - 629145 mmol/L 136  Potassium 3.5 - 5.1 mmol/L 3.5  Chloride 101 - 111 mmol/L 104  CO2 22 - 32 mmol/L 23  Calcium 8.9 - 10.3 mg/dL 8.2(L)  Total Protein 6.5 - 8.1 g/dL 7.5  Total Bilirubin 0.3 - 1.2 mg/dL 0.9  Alkaline Phos 38 - 126 U/L 55  AST 15 - 41 U/L 19  ALT 14 - 54 U/L 24    Discharge instruction: per After Visit Summary and "Baby and Me Booklet".  After visit meds:  Allergies as of 05/01/2018   No  Known Allergies     Medication List    STOP taking these medications   ondansetron 8 MG disintegrating tablet Commonly known as:  ZOFRAN-ODT   terconazole 0.4 % vaginal cream Commonly known as:  TERAZOL 7     TAKE these medications   ibuprofen 600 MG tablet Commonly known as:  ADVIL,MOTRIN Take 1 tablet (600 mg total) by mouth every 6 (six) hours.   TUMS PO Take 2 tablets by mouth 2 (two) times daily as needed (for heartburn).       Diet: routine diet  Activity: Advance as tolerated. Pelvic rest for 6 weeks.   Outpatient follow up:4 weeks  Follow up Appt: Future Appointments  Date Time Provider Department Center  05/26/2018  4:00 PM Tilda Burrow, MD [facility]   Follow up Visit:No follow-ups on file.  Postpartum contraception: Nexplanon desired  Newborn Data: Live born female  Birth Weight: 3 lb 15.1 oz (1790 g) APGAR: 8, 9  Newborn Delivery   Birth date/time:  04/29/2018 00:41:00 Delivery type:  Vaginal, Spontaneous     Baby Feeding: Breast Disposition:NICU   05/01/2018 Candis Schatz, DO

## 2018-05-01 NOTE — Lactation Note (Signed)
This note was copied from a baby's chart. Lactation Consultation Note  Patient Name: Boy Robynn PaneLashonda Ager WUJWJ'XToday's Date: 05/01/2018 Reason for consult: Follow-up assessment;1st time breastfeeding;NICU baby;Early term 37-38.6wks;Infant < 6lbs;Infant weight loss  Mom with NICU baby, she's going home today but disappointed about it because now she has to drive to the hospital to visit her baby in NICU. Mom already called [facility] to get her DEBP and she'll be going there on Monday to get it. [facility], she said she needed to use those 30 dollars to pay for her older child daycare. Mom will be using her hand pump in the meantime till Monday. Advised mom to take all the DEBP parts with her and to bring them to the hospital with her every time she comes to visit her NICU baby so she can make use of our pumping rooms.   Mom was pumping when entering the room, she has been pumping 8 times/24 hours and will continue doing so. Noticed the junctures in her pump were lose, LC tighten them up and let mom know that she'll feel a difference in the suction level. Reviewed engorgement prevention and treatment and discharge instructions; as well as milk storage guidelines for NICU babies. Mom is aware of LC OP services and will call PRN.  Maternal Data    Feeding Feeding Type: Donor Breast Milk Nipple Type: Slow - flow Length of feed: 10 min  Interventions Interventions: Breast feeding basics reviewed;DEBP;Breast massage;Expressed milk;Breast compression  Lactation Tools Discussed/Used     Consult Status Consult Status: Complete Date: 05/01/18 Follow-up type: Call as needed    Agostino Gorin Venetia ConstableS Eliani Leclere 05/01/2018, 11:28 AM

## 2018-05-01 NOTE — Progress Notes (Signed)
Discharge instructions reviewed with pt.  Discussed post-vaginal delivery care, signs and symptoms to report, upcoming appointments, and meds. Pt verbalized understanding and has no questions or concerns at this time. Pt was sent home with a hand pump and was encouraged to keep pumping every 3 hours. Pt discharged in stable condition.

## 2018-05-03 ENCOUNTER — Ambulatory Visit: Payer: Self-pay

## 2018-05-03 NOTE — Lactation Note (Signed)
This note was copied from a baby's chart. Lactation Consultation Note  Patient Name: Boy Robynn PaneLashonda Kaluzny JYNWG'NToday's Date: 05/03/2018    [facility]  Mother in NICU visiting baby.  RN has requested a visit due to flat nipples.  Mother was leaving as I was entering NICU.  However, I spoke with her and she wanted to try a NS to help baby latch.  Since baby had just finished bottle feeding I did not attempt a latch but did obtain a #20 NS which fit well.  She will be back tomorrow and would like to try latching baby using the NS.  [facility]  I also provided breast shells with instructions for use.  Her breasts were full and nipples flat bilaterally.  She was leaking milk and was going to pump before leaving the hospital.                     Irene PapBeth R [facility] Institute LLCDelFava 05/03/2018, 2:47 PM

## 2018-05-03 NOTE — Clinical Social Work Maternal (Signed)
CLINICAL SOCIAL WORK MATERNAL/CHILD NOTE  Patient Details  Name: Caroline Barnes MRN: [id number] Date of Birth: November 23, 1980  Date:  05/03/2018  Clinical Social Worker Initiating Note:  Laurey Arrow Date/Time: Initiated:  04/30/18/1000     Child's Name:  Caroline Barnes   Biological Parents:  Mother   Need for Interpreter:  None   Reason for Referral:  Parental Support of Premature Babies < 32 weeks/or Critically Ill babies   Address:  [address] [place] [address]    Phone number:  [phone] (home)     Additional phone number:  Household Members/Support Persons (HM/SP):   Household Member/Support Person 1   HM/SP Name Relationship DOB or Age  HM/SP -1 Caroline Barnes son 01/29/2017  HM/SP -2        HM/SP -3        HM/SP -4        HM/SP -5        HM/SP -6        HM/SP -7        HM/SP -8          Natural Supports (not living in the home):  Parent, Spouse/significant other, Immediate Family   Professional Supports: None   Employment: Full-time   Type of Work: Radiation protection practitioner for [employer]   Education:      Homebound arranged:    Museum/gallery curator Resources:  Medicaid   Other Resources:  Physicist, medical    Cultural/Religious Considerations Which May Impact Care:  None reported  Strengths:  Ability to meet basic needs , Pediatrician chosen   Psychotropic Medications:         Pediatrician:    [employer]  Pediatrician List:   [place]      Pediatrician Fax Number:    Risk Factors/Current Problems:  None   Cognitive State:  Able to Concentrate , Alert , Linear Thinking , Insightful    Mood/Affect:  Happy , Relaxed , Interested , Comfortable , Calm    CSW Assessment:CSW met with MOB in the NICU conference room.  MOB was pleasant and receptive to meeting with CSW.  MOB communicated being familiar  with NICU due to her previous girlfriend giving birth to a premature baby about 5 years ago. MOB was polite easy to engage, and receptive to meeting with CSW.    CSW asked about MOB's thoughts and feelings about infant's NICU admission.  MOB reported feelings good and reported having a good understanding of infant's health.   CSW provided education regarding PPD and MOB was understanding.CSW discussed common emotions often experienced related to a NICU admission as well as during the first couple weeks of the postpartum period  MOB agreed to seek help from [facility] provider if a need arise. MOB denied having PPD signs or symptoms with MOB's oldest child. MOB also reported having a good support team that consist mainly of MOB's family.    MOB reported having all essential items for infant and feeling prepared to parent.   CSW discussed SSI in which baby qualifies for due to low birth weight. MOB was interested in applying and CSW provided required information. CSW also explained process and steps.   CSW will continue to provide resources and supports to family while infant remains in NICU.   CSW Plan/Description:  Psychosocial Support and Ongoing Assessment of Needs, Sudden  Infant Death Syndrome (SIDS) Education, Perinatal Mood and Anxiety Disorder (PMADs) Education, Other Patient/Family Education, [employer] Income (SSI) Information   Laurey Arrow, MSW, LCSW Clinical Social Work [phone]   Dimple Nanas, LCSW 05/03/2018, 1:58 PM

## 2018-05-04 ENCOUNTER — Other Ambulatory Visit: Payer: BLUE CROSS/BLUE SHIELD

## 2018-05-04 ENCOUNTER — Encounter: Payer: BLUE CROSS/BLUE SHIELD | Admitting: Obstetrics & Gynecology

## 2018-05-07 ENCOUNTER — Other Ambulatory Visit: Payer: BLUE CROSS/BLUE SHIELD | Admitting: Obstetrics and Gynecology

## 2018-05-26 ENCOUNTER — Ambulatory Visit: Payer: BLUE CROSS/BLUE SHIELD | Admitting: Obstetrics and Gynecology

## 2018-05-26 ENCOUNTER — Ambulatory Visit (INDEPENDENT_AMBULATORY_CARE_PROVIDER_SITE_OTHER): Payer: BLUE CROSS/BLUE SHIELD | Admitting: Advanced Practice Midwife

## 2018-05-26 ENCOUNTER — Encounter: Payer: Self-pay | Admitting: Advanced Practice Midwife

## 2018-05-26 DIAGNOSIS — Z029 Encounter for administrative examinations, unspecified: Secondary | ICD-10-CM

## 2018-05-26 NOTE — Progress Notes (Signed)
Caroline Barnes is a 37 y.o. who presents for a postpartum visit. She is 4 weeks postpartum following a spontaneous vaginal delivery. I have fully reviewed the prenatal and intrapartum course. The delivery was at 37 gestational weeks. IOL for IUGR Anesthesia: epidural. Postpartum course has been uneventful. Baby's course has been complicated by a NICU admission, for a week Baby is feeding by pumped breast milk and formul. Bleeding: staining only. Bowel function is normal. Bladder function is normal. Patient is not sexually active. Contraception method is abstinence. Postpartum depression screening: negative.  No current outpatient medications on file.  Review of Systems   Constitutional: Negative for fever and chills Eyes: Negative for visual disturbances Respiratory: Negative for shortness of breath, dyspnea Cardiovascular: Negative for chest pain or palpitations  Gastrointestinal: Negative for vomiting, diarrhea and constipation Genitourinary: Negative for dysuria and urgency Musculoskeletal: Negative for back pain, joint pain, myalgias  Neurological: Negative for dizziness and headaches    Objective:     Vitals:   05/26/18 0940  BP: (!) 142/85  Pulse: 76   General:  alert, cooperative and no distress   Breasts:  negative  Lungs: Normal respiratory effort  Heart:  regular rate and rhythm  Abdomen: Soft, nontender   Vulva:  normal  Vagina: normal vagina  Cervix:  closed  Corpus: Well involuted     Rectal Exam: no hemorrhoids        Assessment:    normal postpartum exam.  Plan:   1. Contraception: Nexplanon 2. Follow up in:  3 weeks for Nexplanon (abstinece until then) or as needed.

## 2018-06-16 ENCOUNTER — Encounter: Payer: Self-pay | Admitting: Advanced Practice Midwife

## 2018-06-16 ENCOUNTER — Ambulatory Visit (INDEPENDENT_AMBULATORY_CARE_PROVIDER_SITE_OTHER): Payer: BLUE CROSS/BLUE SHIELD | Admitting: Advanced Practice Midwife

## 2018-06-16 VITALS — BP 148/86 | HR 69 | Ht 66.0 in | Wt 177.0 lb

## 2018-06-16 DIAGNOSIS — Z3046 Encounter for surveillance of implantable subdermal contraceptive: Secondary | ICD-10-CM | POA: Diagnosis not present

## 2018-06-16 DIAGNOSIS — Z30017 Encounter for initial prescription of implantable subdermal contraceptive: Secondary | ICD-10-CM | POA: Insufficient documentation

## 2018-06-16 DIAGNOSIS — Z3202 Encounter for pregnancy test, result negative: Secondary | ICD-10-CM | POA: Diagnosis not present

## 2018-06-16 LAB — POCT URINE PREGNANCY: Preg Test, Ur: NEGATIVE

## 2018-06-16 MED ORDER — ETONOGESTREL 68 MG ~~LOC~~ IMPL
68.0000 mg | DRUG_IMPLANT | Freq: Once | SUBCUTANEOUS | Status: AC
  Administered 2018-06-16: 68 mg via SUBCUTANEOUS

## 2018-06-16 NOTE — Progress Notes (Signed)
  HPI:  Caroline Barnes is a 37 y.o. year old African American female here for Nexplanon insertion.  Her LMP was ? She is postpartum and abstinent since birth , and her pregnancy test today was negative.  Risks/benefits/side effects of Nexplanon have been discussed and her questions have been answered.  Specifically, a failure rate of 09/998 has been reported, with an increased failure rate if pt takes St. John's Wort and/or antiseizure medicaitons.  Caroline Barnes is aware of the common side effect of irregular bleeding, which the incidence of decreases over time.   Past Medical History: Past Medical History:  Diagnosis Date  . Cellulitis     Past Surgical History: Past Surgical History:  Procedure Laterality Date  . NO PAST SURGERIES      Family History: Family History  Problem Relation Age of Onset  . Rashes / Skin problems Cousin        Contact dermatitis  . Cancer Cousin        breast  . Thyroid cancer Mother   . Diabetes Mother   . Cancer Mother        breast  . Stroke Father   . Diabetes Maternal Aunt   . Diabetes Maternal Uncle   . Diabetes Paternal Aunt   . Stroke Paternal Aunt   . Diabetes Paternal Uncle   . Cancer Paternal Grandmother     Social History: Social History   Tobacco Use  . Smoking status: Current Every Day Smoker    Packs/day: 0.50    Types: Cigarettes  . Smokeless tobacco: Never Used  Substance Use Topics  . Alcohol use: No    Frequency: Never    Comment: 2 or 3 days per week.  . Drug use: No    Allergies: No Known Allergies    Her left arm, approximatly 4 inches proximal from the elbow, was cleansed with alcohol and anesthetized with 2cc of 2% Lidocaine.  The area was cleansed again and the Nexplanon was inserted without difficulty.  A pressure bandage was applied.  Pt was instructed to remove pressure bandage in a few hours, and keep insertion site covered with a bandaid for 3 days.  Back up contraception was  recommended for 2 weeks.  Follow-up scheduled PRN problems  Caroline Barnes 06/16/2018 12:25 PM

## 2018-08-17 ENCOUNTER — Emergency Department (HOSPITAL_COMMUNITY): Payer: BLUE CROSS/BLUE SHIELD

## 2018-08-17 ENCOUNTER — Encounter (HOSPITAL_COMMUNITY): Payer: Self-pay | Admitting: Emergency Medicine

## 2018-08-17 ENCOUNTER — Other Ambulatory Visit: Payer: Self-pay

## 2018-08-17 ENCOUNTER — Emergency Department (HOSPITAL_COMMUNITY)
Admission: EM | Admit: 2018-08-17 | Discharge: 2018-08-17 | Disposition: A | Discharge status: Home / Self Care | Payer: BLUE CROSS/BLUE SHIELD | Attending: Emergency Medicine | Admitting: Emergency Medicine

## 2018-08-17 DIAGNOSIS — F17210 Nicotine dependence, cigarettes, uncomplicated: Secondary | ICD-10-CM | POA: Diagnosis not present

## 2018-08-17 DIAGNOSIS — E876 Hypokalemia: Secondary | ICD-10-CM | POA: Insufficient documentation

## 2018-08-17 DIAGNOSIS — L03114 Cellulitis of left upper limb: Secondary | ICD-10-CM | POA: Diagnosis not present

## 2018-08-17 DIAGNOSIS — R600 Localized edema: Secondary | ICD-10-CM | POA: Diagnosis not present

## 2018-08-17 DIAGNOSIS — R2232 Localized swelling, mass and lump, left upper limb: Secondary | ICD-10-CM | POA: Diagnosis present

## 2018-08-17 LAB — CBC WITH DIFFERENTIAL/PLATELET
ABS IMMATURE GRANULOCYTES: 0.01 10*3/uL (ref 0.00–0.07)
Basophils Absolute: 0 10*3/uL (ref 0.0–0.1)
Basophils Relative: 0 %
Eosinophils Absolute: 0.2 10*3/uL (ref 0.0–0.5)
Eosinophils Relative: 4 %
HCT: 40 % (ref 36.0–46.0)
HEMOGLOBIN: 12.3 g/dL (ref 12.0–15.0)
IMMATURE GRANULOCYTES: 0 %
LYMPHS ABS: 1.3 10*3/uL (ref 0.7–4.0)
LYMPHS PCT: 19 %
MCH: 25.9 pg — ABNORMAL LOW (ref 26.0–34.0)
MCHC: 30.8 g/dL (ref 30.0–36.0)
MCV: 84.2 fL (ref 80.0–100.0)
MONO ABS: 0.4 10*3/uL (ref 0.1–1.0)
MONOS PCT: 5 %
NEUTROS PCT: 72 %
Neutro Abs: 4.7 10*3/uL (ref 1.7–7.7)
PLATELETS: 268 10*3/uL (ref 150–400)
RBC: 4.75 MIL/uL (ref 3.87–5.11)
RDW: 14.8 % (ref 11.5–15.5)
WBC: 6.6 10*3/uL (ref 4.0–10.5)
nRBC: 0 % (ref 0.0–0.2)

## 2018-08-17 LAB — I-STAT BETA HCG BLOOD, ED (MC, WL, AP ONLY)

## 2018-08-17 LAB — BASIC METABOLIC PANEL
Anion gap: 6 (ref 5–15)
BUN: 11 mg/dL (ref 6–20)
CO2: 21 mmol/L — ABNORMAL LOW (ref 22–32)
Calcium: 8.6 mg/dL — ABNORMAL LOW (ref 8.9–10.3)
Chloride: 108 mmol/L (ref 98–111)
Creatinine, Ser: 0.98 mg/dL (ref 0.44–1.00)
GFR calc Af Amer: >60 mL/min (ref >60)
GLUCOSE: 95 mg/dL (ref 70–99)
POTASSIUM: 3.2 mmol/L — AB (ref 3.5–5.1)
SODIUM: 135 mmol/L (ref 135–145)

## 2018-08-17 MED ORDER — IOHEXOL 300 MG/ML  SOLN
75.0000 mL | Freq: Once | INTRAMUSCULAR | Status: AC | PRN
  Administered 2018-08-17: 75 mL via INTRAVENOUS

## 2018-08-17 MED ORDER — CEPHALEXIN 500 MG PO CAPS
500.0000 mg | ORAL_CAPSULE | Freq: Once | ORAL | Status: AC
  Administered 2018-08-17: 500 mg via ORAL
  Filled 2018-08-17: qty 1

## 2018-08-17 MED ORDER — CEPHALEXIN 500 MG PO CAPS: ORAL_CAPSULE | ORAL | 0 refills | Status: DC

## 2018-08-17 MED ORDER — IBUPROFEN 400 MG PO TABS
400.0000 mg | ORAL_TABLET | Freq: Once | ORAL | Status: AC
  Administered 2018-08-17: 400 mg via ORAL
  Filled 2018-08-17: qty 1

## 2018-08-17 MED ORDER — POTASSIUM CHLORIDE CRYS ER 20 MEQ PO TBCR
40.0000 meq | EXTENDED_RELEASE_TABLET | Freq: Once | ORAL | Status: AC
  Administered 2018-08-17: 40 meq via ORAL
  Filled 2018-08-17: qty 2

## 2018-08-17 NOTE — Discharge Instructions (Addendum)
Make sure that you finish the antibiotic prescribed.  Take Tylenol or Advil for pain.  Call Dr. Osborne Cascoiego's office tomorrow so that you can get rechecked in 3 days.  If you are unable to see Dr. Janna Archondiego, return here or see an urgent care center to get rechecked in 3 days.  Return to the emergency department sooner if you develop vomiting, or for worse for any reason

## 2018-08-17 NOTE — ED Triage Notes (Signed)
Pt c/o "rash" to upper left arm since this morning, pt had blisters form that have burst, are red, draining and warm to touch, pt states she was previously dx with same on leg as cellulitis

## 2018-08-17 NOTE — ED Provider Notes (Signed)
[facility] EMERGENCY DEPARTMENT Provider Note   CSN: [id number] Arrival date & time: 08/17/18  1918     History   Chief Complaint Chief Complaint  Patient presents with  . Recurrent Skin Infections    HPI Caroline Barnes is a 37 y.o. female.  HPI left upper extremity pain swelling and redness onset this morning.  With "blisters" that broke on her left upper arm.  Symptoms similar to cellulitis she had on her leg 3 years ago.  She denies fever denies vomiting.  Pain is moderate at present.  No treatment prior to coming here.  No other associated symptoms.  Nothing makes symptoms better or worse.  Past Medical History:  Diagnosis Date  . Cellulitis     Patient Active Problem List   Diagnosis Date Noted  . Nexplanon insertion 06/16/2018  . Abnormal Pap smear of cervix 07/28/2016  . Smoker 07/28/2016    Past Surgical History:  Procedure Laterality Date  . NO PAST SURGERIES       OB History    Gravida  2   Para  2   Term  2   Preterm  0   AB  0   Living  2     SAB  0   TAB  0   Ectopic  0   Multiple  0   Live Births  2            Home Medications    Prior to Admission medications   Not on File    Family History Family History  Problem Relation Age of Onset  . Rashes / Skin problems Cousin        Contact dermatitis  . Cancer Cousin        breast  . Thyroid cancer Mother   . Diabetes Mother   . Cancer Mother        breast  . Stroke Father   . Diabetes Maternal Aunt   . Diabetes Maternal Uncle   . Diabetes Paternal Aunt   . Stroke Paternal Aunt   . Diabetes Paternal Uncle   . Cancer Paternal Grandmother     Social History Social History   Tobacco Use  . Smoking status: Current Every Day Smoker    Packs/day: 0.50    Types: Cigarettes  . Smokeless tobacco: Never Used  Substance Use Topics  . Alcohol use: No    Frequency: Never    Comment: 2 or 3 days per week.  . Drug use: No     Allergies   Patient has no  known allergies.   Review of Systems Review of Systems  Constitutional: Negative.   HENT: Negative.   Respiratory: Negative.   Cardiovascular: Negative.   Gastrointestinal: Negative.   Genitourinary:       Spotting started a few days ago.  3 months postpartum  Musculoskeletal: Positive for myalgias.       Left upper extremity pain  Skin: Positive for color change.       Redness to left upper extremity  Allergic/Immunologic:       Tetanus immunization current.  Last tetanus shot she reports is 2015  Neurological: Negative.   Psychiatric/Behavioral: Negative.   All other systems reviewed and are negative.    Physical Exam Updated Vital Signs BP (!) 139/94 (BP Location: Right Arm)   Pulse (!) 105   Temp 99.5 F (37.5 C) (Oral)   Resp 17   Ht 5\' 6"  (1.676 m)   Wt 81.6  kg   LMP 08/17/2018   SpO2 99%   BMI 29.05 kg/m   Physical Exam  Constitutional: She appears well-developed and well-nourished.  HENT:  Head: Normocephalic and atraumatic.  Eyes: Pupils are equal, round, and reactive to light. Conjunctivae are normal.  Neck: Neck supple. No tracheal deviation present. No thyromegaly present.  Cardiovascular: Normal rate and regular rhythm.  No murmur heard. Heart rate counted at 88 bpm by me  Pulmonary/Chest: Effort normal and breath sounds normal.  Abdominal: Soft. Bowel sounds are normal. She exhibits no distension. There is no tenderness.  Obese  Musculoskeletal: Normal range of motion. She exhibits no edema or tenderness.  Upper extremity is swollen red and tender over triceps area with a few discrete areas of weeping overlying reddened area.  No fluctuance or crepitance.  Radial pulse 2+.  No axillary nodes.  Full range of motion.  Good capillary refill. all other extremities without redness swelling or tenderness neurovascularly intact  Neurological: She is alert. Coordination normal.  Skin: Skin is warm and dry. Capillary refill takes less than 2 seconds. No rash  noted.  Reddened at left upper extremity above the elbow.  Otherwise without rash  Psychiatric: She has a normal mood and affect.  Nursing note and vitals reviewed.    ED Treatments / Results  Labs (all labs ordered are listed, but only abnormal results are displayed) Labs Reviewed - No data to display  EKG None  Radiology No results found.  Procedures Procedures (including critical care time)  Medications Ordered in ED Medications - No data to display Results for orders placed or performed during the hospital encounter of 08/17/18  CBC with Differential/Platelet  Result Value Ref Range   WBC 6.6 4.0 - 10.5 K/uL   RBC 4.75 3.87 - 5.11 MIL/uL   Hemoglobin 12.3 12.0 - 15.0 g/dL   HCT 16.1 09.6 - 04.5 %   MCV 84.2 80.0 - 100.0 fL   MCH 25.9 (L) 26.0 - 34.0 pg   MCHC 30.8 30.0 - 36.0 g/dL   RDW 40.9 81.1 - 91.4 %   Platelets 268 150 - 400 K/uL   nRBC 0.0 0.0 - 0.2 %   Neutrophils Relative % 72 %   Neutro Abs 4.7 1.7 - 7.7 K/uL   Lymphocytes Relative 19 %   Lymphs Abs 1.3 0.7 - 4.0 K/uL   Monocytes Relative 5 %   Monocytes Absolute 0.4 0.1 - 1.0 K/uL   Eosinophils Relative 4 %   Eosinophils Absolute 0.2 0.0 - 0.5 K/uL   Basophils Relative 0 %   Basophils Absolute 0.0 0.0 - 0.1 K/uL   Immature Granulocytes 0 %   Abs Immature Granulocytes 0.01 0.00 - 0.07 K/uL  Basic metabolic panel  Result Value Ref Range   Sodium 135 135 - 145 mmol/L   Potassium 3.2 (L) 3.5 - 5.1 mmol/L   Chloride 108 98 - 111 mmol/L   CO2 21 (L) 22 - 32 mmol/L   Glucose, Bld 95 70 - 99 mg/dL   BUN 11 6 - 20 mg/dL   Creatinine, Ser 7.82 0.44 - 1.00 mg/dL   Calcium 8.6 (L) 8.9 - 10.3 mg/dL   GFR calc non Af Amer >60 >60 mL/min   GFR calc Af Amer >60 >60 mL/min   Anion gap 6 5 - 15  I-Stat Beta hCG blood, ED ([facility], [facility], [facility] only)  Result Value Ref Range   I-stat hCG, quantitative <5.0 <5 mIU/mL   Comment 3  Ct Humerus Left W Contrast  Result Date: 08/17/2018 CLINICAL DATA:  Rash with  warm EXAM: CT OF THE UPPER LEFT EXTREMITY WITH CONTRAST TECHNIQUE: Multidetector CT imaging of the upper left extremity was performed according to the standard protocol following intravenous contrast administration. COMPARISON:  None. CONTRAST:  75mL OMNIPAQUE IOHEXOL 300 MG/ML  SOLN FINDINGS: Bones/Joint/Cartilage No fracture or malalignment.  No periostitis or bone destruction. Ligaments Suboptimally assessed by CT. Muscles and Tendons No significant muscular atrophy.  No obvious intramuscular mass. Soft tissues Moderate edema within the subcutaneous soft tissues of the mid upper arm, extending to the proximal forearm. Fluid along the fascial planes of the triceps muscles without strong rim enhancement. Increased low-density fluid posterior to the elbow on the radial side. No soft tissue emphysema or radiopaque foreign body. IMPRESSION: 1. No acute osseous abnormality. No bone destruction or periostitis to suggest osteomyelitis. 2. Moderate edema within the subcutaneous soft tissues extending from the mid upper arm to the proximal forearm consistent with a cellulitis. Nonspecific fluid is seen along the fascial planes without strong rim enhancement to suggest abscess. More prominent fluid posterior to the elbow, could be due to bursal inflammation. If clinical suspicion remains for soft tissue abscess, contrast-enhanced MRI should be considered to further evaluate. Electronically Signed   By: Jasmine PangKim  Fujinaga M.D.   On: 08/17/2018 21:48    Initial Impression / Assessment and Plan / ED Course  I have reviewed the triage vital signs and the nursing notes.  Pertinent labs & imaging results that were available during my care of the patient were reviewed by me and considered in my medical decision making (see chart for details).     Lab work consistent with mild hypokalemia. CT scan reviewed by me.  Clinically patient has cellulitis.  No evidence of abscess. Plan prescription Keflex.  Tylenol or ibuprofen for  pain.  She will receive ibuprofen and first dose of Keflex prior to discharge.  Recheck 3 days at PMDs office or return here or see urgent care center for recheck if unable to see PMD.  Return precautions given if feels worse, patient discretion, fever, vomiting Final Clinical Impressions(s) / ED Diagnoses  Dx #1 cellulitis of left arm #2 hypokalemia Final diagnoses:  None    ED Discharge Orders    None       Doug SouJacubowitz, Raniah Karan, MD 08/17/18 2200

## 2018-10-05 DIAGNOSIS — R5383 Other fatigue: Secondary | ICD-10-CM | POA: Diagnosis not present

## 2018-10-05 DIAGNOSIS — E662 Morbid (severe) obesity with alveolar hypoventilation: Secondary | ICD-10-CM | POA: Diagnosis not present

## 2018-10-05 DIAGNOSIS — D510 Vitamin B12 deficiency anemia due to intrinsic factor deficiency: Secondary | ICD-10-CM | POA: Diagnosis not present

## 2018-10-05 DIAGNOSIS — R21 Rash and other nonspecific skin eruption: Secondary | ICD-10-CM | POA: Diagnosis not present

## 2018-10-05 DIAGNOSIS — E7849 Other hyperlipidemia: Secondary | ICD-10-CM | POA: Diagnosis not present

## 2018-10-05 DIAGNOSIS — E559 Vitamin D deficiency, unspecified: Secondary | ICD-10-CM | POA: Diagnosis not present

## 2018-10-05 DIAGNOSIS — I110 Hypertensive heart disease with heart failure: Secondary | ICD-10-CM | POA: Diagnosis not present

## 2018-10-15 DIAGNOSIS — R5382 Chronic fatigue, unspecified: Secondary | ICD-10-CM | POA: Diagnosis not present

## 2018-10-15 DIAGNOSIS — R21 Rash and other nonspecific skin eruption: Secondary | ICD-10-CM | POA: Diagnosis not present

## 2019-04-30 IMAGING — CT CT HUMERUS*L* W/CM
3 of 4 series · 7 of 33 positions shown, 9 images · IV contrast (Isovue)
Comparison: None.

CONTRAST:  75mL OMNIPAQUE IOHEXOL 300 MG/ML  SOLN

CLINICAL DATA: Rash with warm

EXAM:
CT OF THE UPPER LEFT EXTREMITY WITH CONTRAST
TECHNIQUE: Multidetector CT imaging of the upper left extremity was performed
according to the standard protocol following intravenous contrast
administration.

[Series 6: axial st · coronal · 0.33mm/px · 1 of 99 slices shown (1 of 2)]
[im 50/99  bone]
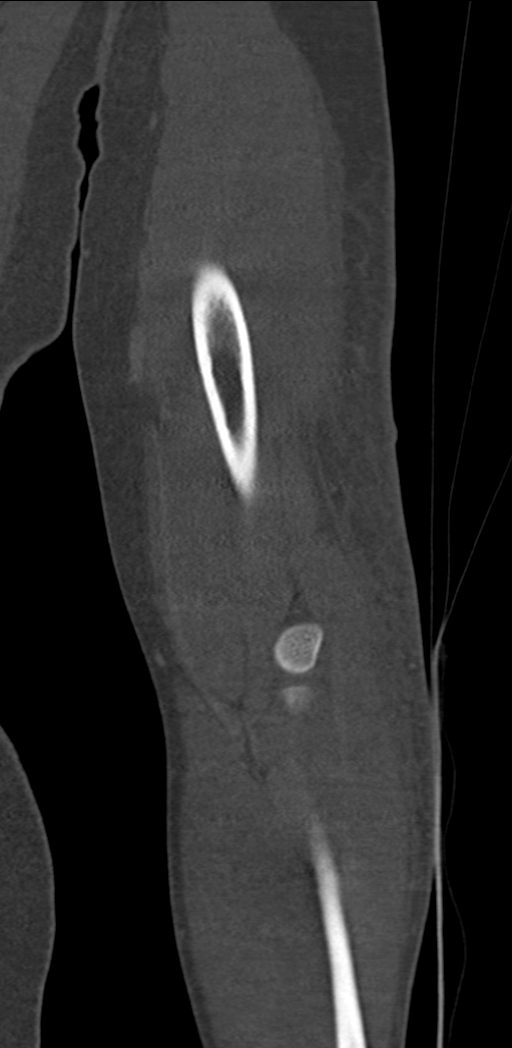

[Series 10: sag st · sagittal · 0.25mm/px · 5 of 72 slices shown, 6 images]
[im 24/72  bone]
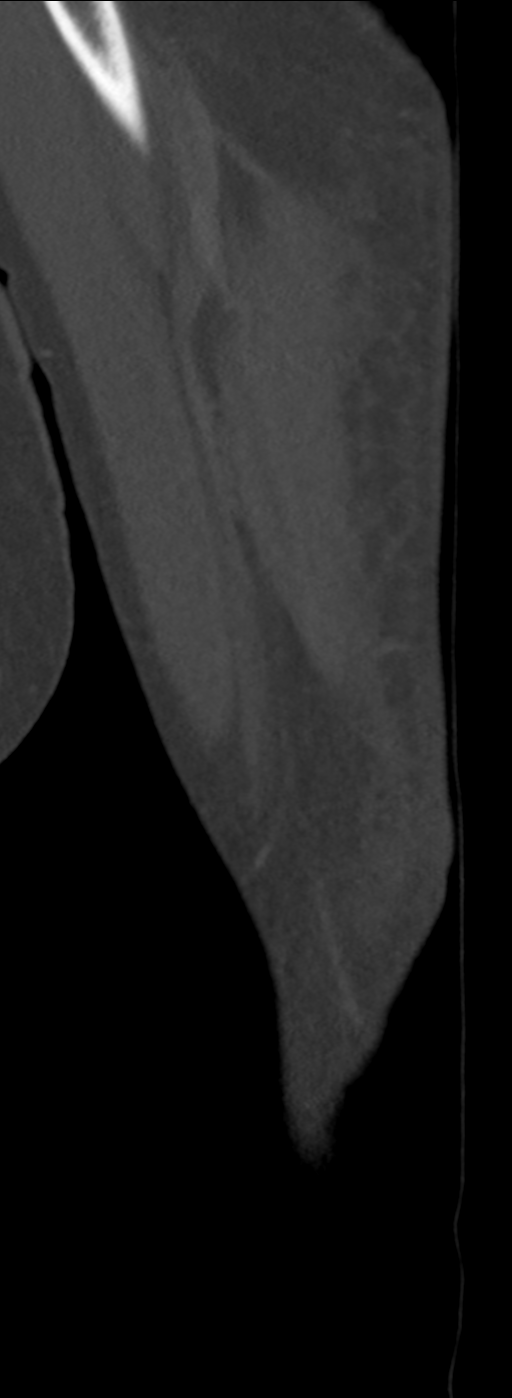
[im 30/72  bone]
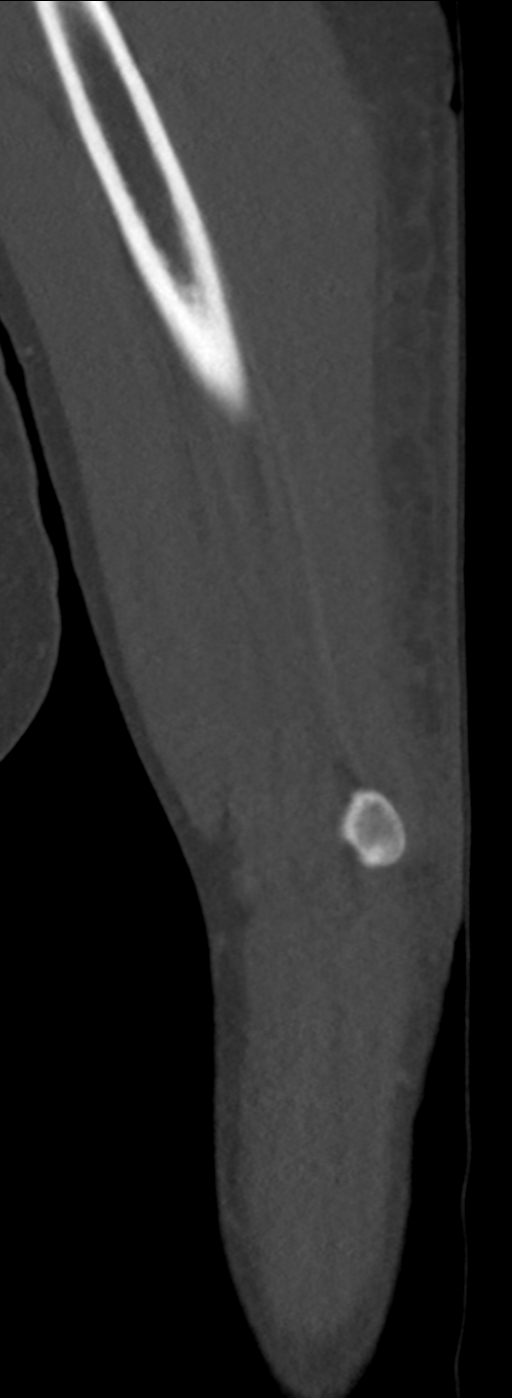
[im 36/72  soft-tissue]
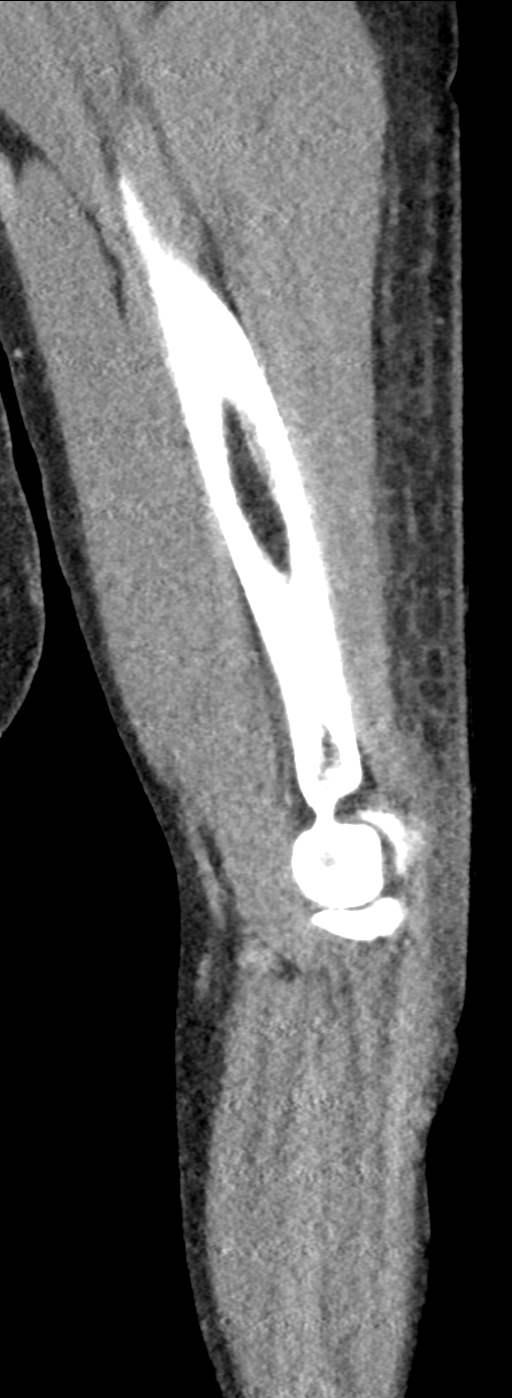
[im 36/72  bone]
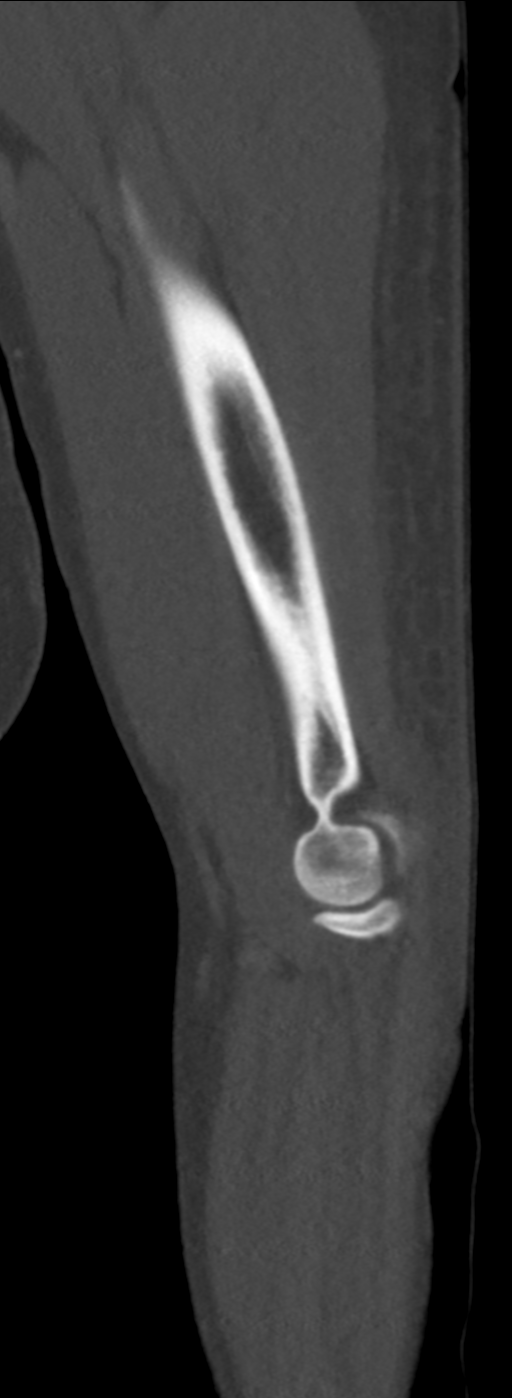
[im 42/72  bone]
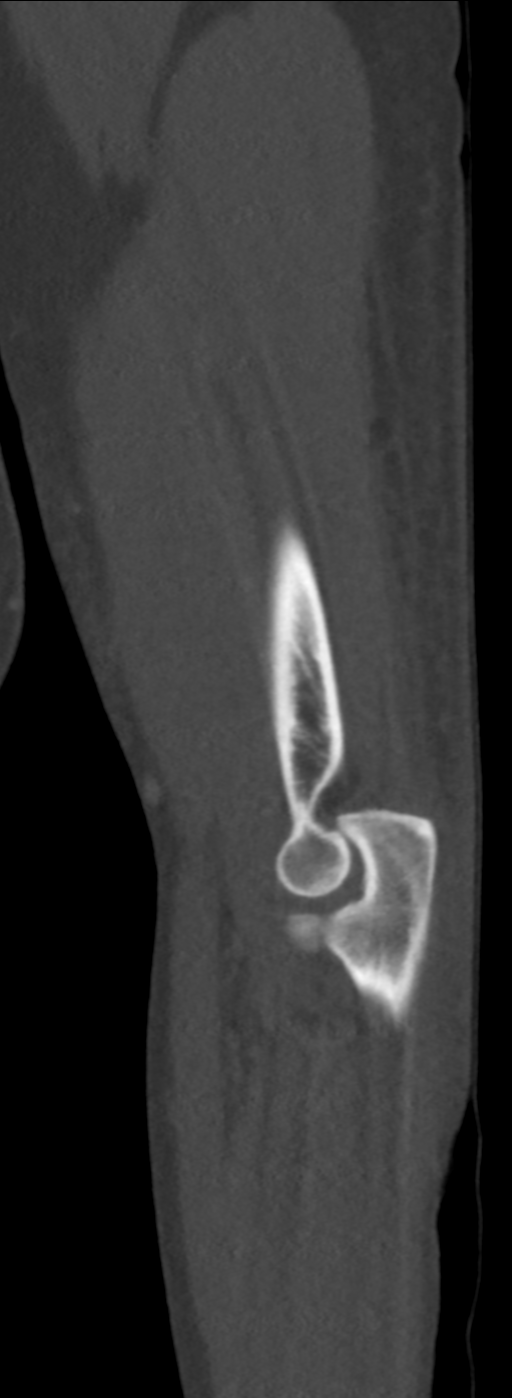
[im 48/72  bone]
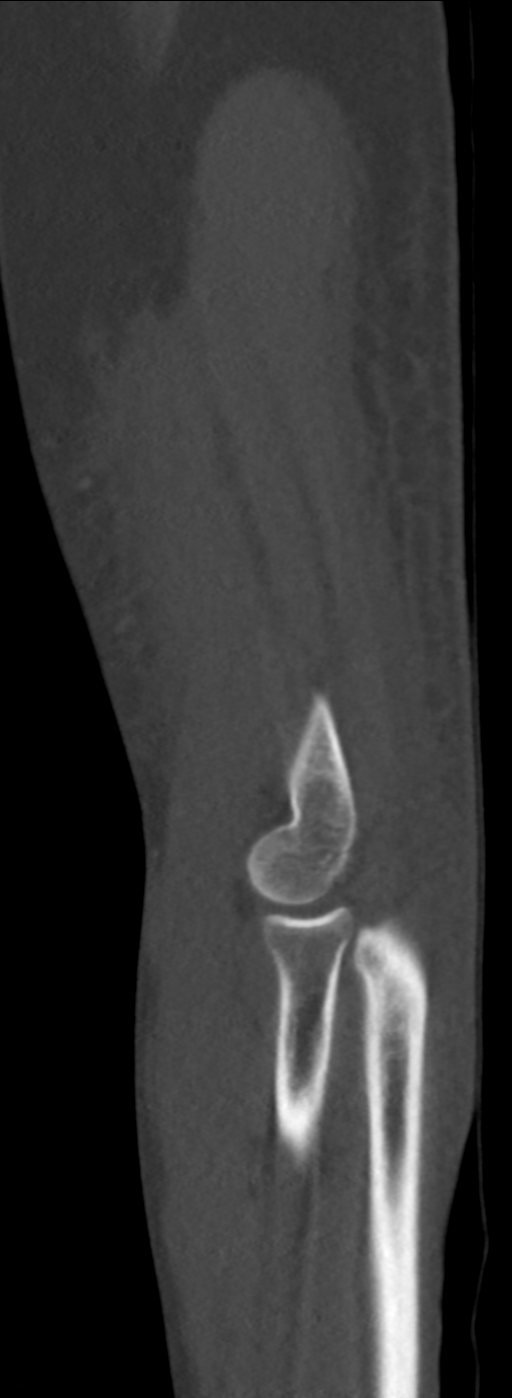

[Series 11: axial st · axial · 0.35mm/px · z∈[+1205,+1205]mm · 1 of 173 slices shown, 2 images (2 of 2)]
[im 93/173  soft-tissue]
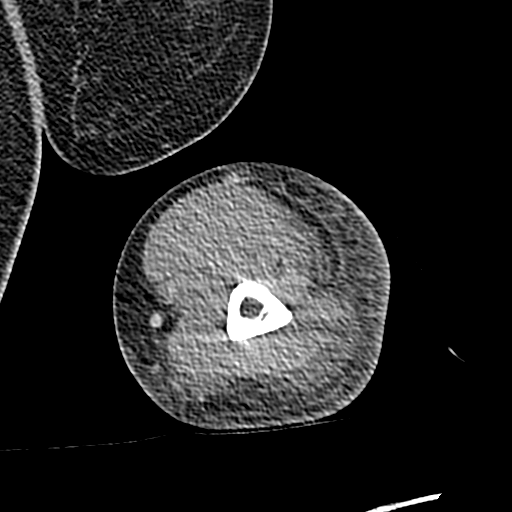
[im 93/173  bone]
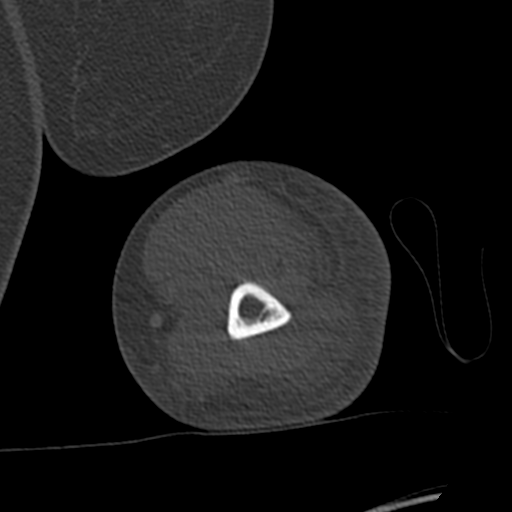

[7 of 33 positions shown; findings below may reference images not displayed]

FINDINGS: Bones/Joint/Cartilage

No fracture or malalignment.  No periostitis or bone destruction.

Ligaments

Suboptimally assessed by CT.

Muscles and Tendons

No significant muscular atrophy.  No obvious intramuscular mass.

Soft tissues

Moderate edema within the subcutaneous soft tissues of the mid upper
arm, extending to the proximal forearm. Fluid along the fascial
planes of the triceps muscles without strong rim enhancement.
Increased low-density fluid posterior to the elbow on the radial
side. No soft tissue emphysema or radiopaque foreign body.
IMPRESSION: 1. No acute osseous abnormality. No bone destruction or periostitis
to suggest osteomyelitis.
2. Moderate edema within the subcutaneous soft tissues extending
from the mid upper arm to the proximal forearm consistent with a
cellulitis. Nonspecific fluid is seen along the fascial planes
without strong rim enhancement to suggest abscess. More prominent
fluid posterior to the elbow, could be due to bursal inflammation.
If clinical suspicion remains for soft tissue abscess,
contrast-enhanced MRI should be considered to further evaluate.

## 2019-05-05 ENCOUNTER — Other Ambulatory Visit: Payer: Self-pay

## 2019-05-05 DIAGNOSIS — Z20822 Contact with and (suspected) exposure to covid-19: Secondary | ICD-10-CM

## 2019-05-07 LAB — NOVEL CORONAVIRUS, NAA: SARS-CoV-2, NAA: NOT DETECTED

## 2019-06-08 ENCOUNTER — Other Ambulatory Visit: Payer: Self-pay

## 2019-06-08 DIAGNOSIS — U071 COVID-19: Secondary | ICD-10-CM

## 2019-06-09 LAB — NOVEL CORONAVIRUS, NAA: SARS-CoV-2, NAA: NOT DETECTED

## 2019-10-05 ENCOUNTER — Ambulatory Visit: Payer: Medicaid Other | Attending: Internal Medicine

## 2019-10-05 ENCOUNTER — Other Ambulatory Visit: Payer: Self-pay

## 2019-10-05 DIAGNOSIS — Z20822 Contact with and (suspected) exposure to covid-19: Secondary | ICD-10-CM

## 2019-10-06 LAB — NOVEL CORONAVIRUS, NAA: SARS-CoV-2, NAA: NOT DETECTED

## 2020-11-19 DIAGNOSIS — F438 Other reactions to severe stress: Secondary | ICD-10-CM | POA: Diagnosis not present

## 2020-11-26 DIAGNOSIS — F438 Other reactions to severe stress: Secondary | ICD-10-CM | POA: Diagnosis not present

## 2020-12-04 DIAGNOSIS — H1031 Unspecified acute conjunctivitis, right eye: Secondary | ICD-10-CM | POA: Diagnosis not present

## 2020-12-05 DIAGNOSIS — F438 Other reactions to severe stress: Secondary | ICD-10-CM | POA: Diagnosis not present

## 2020-12-10 DIAGNOSIS — F438 Other reactions to severe stress: Secondary | ICD-10-CM | POA: Diagnosis not present

## 2020-12-19 DIAGNOSIS — F438 Other reactions to severe stress: Secondary | ICD-10-CM | POA: Diagnosis not present

## 2021-01-02 DIAGNOSIS — F438 Other reactions to severe stress: Secondary | ICD-10-CM | POA: Diagnosis not present

## 2021-01-07 DIAGNOSIS — F438 Other reactions to severe stress: Secondary | ICD-10-CM | POA: Diagnosis not present

## 2021-01-16 DIAGNOSIS — F438 Other reactions to severe stress: Secondary | ICD-10-CM | POA: Diagnosis not present

## 2021-01-21 DIAGNOSIS — F438 Other reactions to severe stress: Secondary | ICD-10-CM | POA: Diagnosis not present

## 2021-01-31 DIAGNOSIS — F438 Other reactions to severe stress: Secondary | ICD-10-CM | POA: Diagnosis not present

## 2021-02-04 DIAGNOSIS — F438 Other reactions to severe stress: Secondary | ICD-10-CM | POA: Diagnosis not present

## 2021-02-28 DIAGNOSIS — F438 Other reactions to severe stress: Secondary | ICD-10-CM | POA: Diagnosis not present

## 2021-03-04 DIAGNOSIS — F438 Other reactions to severe stress: Secondary | ICD-10-CM | POA: Diagnosis not present

## 2021-03-14 DIAGNOSIS — F438 Other reactions to severe stress: Secondary | ICD-10-CM | POA: Diagnosis not present

## 2021-03-18 DIAGNOSIS — F438 Other reactions to severe stress: Secondary | ICD-10-CM | POA: Diagnosis not present

## 2021-03-28 DIAGNOSIS — F438 Other reactions to severe stress: Secondary | ICD-10-CM | POA: Diagnosis not present

## 2021-04-01 DIAGNOSIS — F438 Other reactions to severe stress: Secondary | ICD-10-CM | POA: Diagnosis not present

## 2021-04-11 DIAGNOSIS — F438 Other reactions to severe stress: Secondary | ICD-10-CM | POA: Diagnosis not present

## 2021-04-15 DIAGNOSIS — F438 Other reactions to severe stress: Secondary | ICD-10-CM | POA: Diagnosis not present

## 2021-05-09 DIAGNOSIS — F438 Other reactions to severe stress: Secondary | ICD-10-CM | POA: Diagnosis not present

## 2021-05-13 DIAGNOSIS — F438 Other reactions to severe stress: Secondary | ICD-10-CM | POA: Diagnosis not present

## 2021-05-22 ENCOUNTER — Encounter: Payer: Medicaid Other | Admitting: Advanced Practice Midwife

## 2021-05-24 DIAGNOSIS — F438 Other reactions to severe stress: Secondary | ICD-10-CM | POA: Diagnosis not present

## 2021-06-10 DIAGNOSIS — F438 Other reactions to severe stress: Secondary | ICD-10-CM | POA: Diagnosis not present

## 2021-06-19 ENCOUNTER — Other Ambulatory Visit (HOSPITAL_COMMUNITY)
Admission: RE | Admit: 2021-06-19 | Discharge: 2021-06-19 | Disposition: A | Discharge status: Home / Self Care | Payer: Medicaid Other | Source: Ambulatory Visit | Attending: Advanced Practice Midwife | Admitting: Advanced Practice Midwife

## 2021-06-19 ENCOUNTER — Other Ambulatory Visit: Payer: Self-pay

## 2021-06-19 ENCOUNTER — Ambulatory Visit (INDEPENDENT_AMBULATORY_CARE_PROVIDER_SITE_OTHER): Payer: Medicaid Other | Admitting: Advanced Practice Midwife

## 2021-06-19 ENCOUNTER — Encounter: Payer: Self-pay | Admitting: Advanced Practice Midwife

## 2021-06-19 VITALS — BP 173/108 | HR 77 | Ht 66.0 in | Wt 188.4 lb

## 2021-06-19 DIAGNOSIS — I10 Essential (primary) hypertension: Secondary | ICD-10-CM | POA: Diagnosis not present

## 2021-06-19 DIAGNOSIS — Z01419 Encounter for gynecological examination (general) (routine) without abnormal findings: Secondary | ICD-10-CM | POA: Diagnosis not present

## 2021-06-19 MED ORDER — AMLODIPINE BESYLATE 5 MG PO TABS: 5.0000 mg | ORAL_TABLET | Freq: Every day | ORAL | 3 refills | Status: DC

## 2021-06-19 NOTE — Progress Notes (Signed)
WELL-WOMAN EXAMINATION Patient name: Caroline Barnes MRN [id number]  Date of birth: Nov 25, 1980 Chief Complaint:   Gynecologic Exam (Pap Rosanne Gutting)  History of Present Illness:   Caroline Barnes is a 40 y.o. [phone] African-American female being seen today for a routine well-woman exam.  Current complaints: doing well; happy with Nexplanon and requests removal/reinsertion (will need to sched another visit due to [facility] MCD); has a little bleeding every month  Chart review shows BP 139/94 at ED visit 07/2018  PCP: Dr Janna Arch      does not desire labs (no bloodwork) Patient's last menstrual period was 06/11/2021. The current method of family planning is Nexplanon.  Last pap Jan 2019. Results were: NILM w/ HRHPV negative. H/O abnormal pap: yes 2017 ASCUS, -HRHPV Last mammogram: never. Results were: N/A. Family h/o breast cancer: yes , mother dx at age 58 Last colonoscopy: never. Results were: N/A. Family h/o colorectal cancer: no  Depression screen [facility] 2/9 06/19/2021 10/20/2017 07/28/2016  Decreased Interest 0 0 1  Down, Depressed, Hopeless 0 0 0  PHQ - 2 Score 0 0 1  Altered sleeping 0 0 0  Tired, decreased energy 0 1 1  Change in appetite 0 0 0  Feeling bad or failure about yourself  0 0 0  Trouble concentrating 0 0 0  Moving slowly or fidgety/restless 0 0 0  Suicidal thoughts 0 0 0  PHQ-9 Score 0 1 2     GAD 7 : Generalized Anxiety Score 06/19/2021  Nervous, Anxious, on Edge 0  Control/stop worrying 0  Worry too much - different things 0  Trouble relaxing 0  Restless 0  Easily annoyed or irritable 0  Afraid - awful might happen 0  Total GAD 7 Score 0     Review of Systems:   Pertinent items are noted in HPI Denies any headaches, blurred vision, fatigue, shortness of breath, chest pain, abdominal pain, abnormal vaginal discharge/itching/odor/irritation, problems with periods, bowel movements, urination, or intercourse unless otherwise stated above. Pertinent  History Reviewed:  Reviewed past medical,surgical, social and family history.  Reviewed problem list, medications and allergies. Physical Assessment:   Vitals:   06/19/21 0834  BP: (!) 173/108  Pulse: 77  Weight: 188 lb 6.4 oz (85.5 kg)  Height: 5\' 6"  (1.676 m)  Body mass index is 30.41 kg/m.        Physical Examination:   General appearance - well appearing, and in no distress  Mental status - alert, oriented to person, place, and time  Psych:  She has a normal mood and affect  Skin - warm and dry, normal color, no suspicious lesions noted  Chest - effort normal, all lung fields clear to auscultation bilaterally  Heart - normal rate and regular rhythm  Neck:  midline trachea, no thyromegaly or nodules  Breasts - breasts appear normal, no suspicious masses, no skin or nipple changes or  axillary nodes  Abdomen - soft, nontender, nondistended, no masses or organomegaly  Pelvic - VULVA: normal appearing vulva with no masses, tenderness or lesions  VAGINA: normal appearing vagina with normal color and discharge, no lesions  CERVIX: normal appearing cervix without discharge or lesions, no CMT  Thin prep pap is done with HR HPV cotesting  UTERUS: uterus is felt to be normal size, shape, consistency and nontender   ADNEXA: No adnexal masses or tenderness noted.  Rectal - not examined  Extremities:  No swelling or varicosities noted  Chaperone: Peggy Dones    No results  found for this or any previous visit (from the past 24 hour(s)).  Assessment & Plan:  1) Well-Woman Exam  2) New dx cHTN, values today elevated and ED visit 07/2018 with elevations; encouraged exercise and to sched PCP visit ASAP; rx Norvasc 5mg  to start today  3) Nexplanon inserted x 44yrs ago, on 06/16/18; will schedule first avail removal/reinsertion  Labs/procedures today: Pap/GC/chlam  Mammogram: @ 40yo, or sooner if problems Colonoscopy: @ 40yo, or sooner if problems  No orders of the defined types were  placed in this encounter.   Meds:  Meds ordered this encounter  Medications   amLODipine (NORVASC) 5 MG tablet    Sig: Take 1 tablet (5 mg total) by mouth daily.    Dispense:  60 tablet    Refill:  3    Order Specific Question:   Supervising Provider    Answer:   06/18/18 [[id number]]    Follow-up: Return for Nexplanon removal/reinsertion, 1st available.  Lazaro Arms CNM 06/19/2021 9:08 AM

## 2021-06-21 LAB — CYTOLOGY - PAP
Chlamydia: NEGATIVE
Comment: NEGATIVE
Comment: NEGATIVE
Comment: NORMAL
Diagnosis: NEGATIVE
Diagnosis: REACTIVE
High risk HPV: NEGATIVE
Neisseria Gonorrhea: NEGATIVE

## 2021-06-22 ENCOUNTER — Other Ambulatory Visit: Payer: Self-pay | Admitting: Advanced Practice Midwife

## 2021-06-22 ENCOUNTER — Encounter: Payer: Self-pay | Admitting: Advanced Practice Midwife

## 2021-06-22 DIAGNOSIS — A599 Trichomoniasis, unspecified: Secondary | ICD-10-CM | POA: Insufficient documentation

## 2021-06-22 MED ORDER — METRONIDAZOLE 500 MG PO TABS: 500.0000 mg | ORAL_TABLET | Freq: Two times a day (BID) | ORAL | 0 refills | Status: DC

## 2021-06-24 DIAGNOSIS — F4389 Other reactions to severe stress: Secondary | ICD-10-CM | POA: Diagnosis not present

## 2021-07-02 ENCOUNTER — Encounter: Payer: Medicaid Other | Admitting: Adult Health

## 2021-07-08 ENCOUNTER — Encounter: Payer: Self-pay | Admitting: Adult Health

## 2021-07-08 ENCOUNTER — Ambulatory Visit (INDEPENDENT_AMBULATORY_CARE_PROVIDER_SITE_OTHER): Payer: Medicaid Other | Admitting: Adult Health

## 2021-07-08 ENCOUNTER — Other Ambulatory Visit: Payer: Self-pay

## 2021-07-08 VITALS — BP 156/102 | HR 73 | Ht 66.0 in | Wt 188.0 lb

## 2021-07-08 DIAGNOSIS — I10 Essential (primary) hypertension: Secondary | ICD-10-CM

## 2021-07-08 DIAGNOSIS — Z3046 Encounter for surveillance of implantable subdermal contraceptive: Secondary | ICD-10-CM | POA: Diagnosis not present

## 2021-07-08 DIAGNOSIS — F4389 Other reactions to severe stress: Secondary | ICD-10-CM | POA: Diagnosis not present

## 2021-07-08 DIAGNOSIS — Z3202 Encounter for pregnancy test, result negative: Secondary | ICD-10-CM | POA: Insufficient documentation

## 2021-07-08 LAB — POCT URINE PREGNANCY: Preg Test, Ur: NEGATIVE

## 2021-07-08 MED ORDER — ETONOGESTREL 68 MG ~~LOC~~ IMPL
68.0000 mg | DRUG_IMPLANT | Freq: Once | SUBCUTANEOUS | Status: AC
  Administered 2021-07-08: 68 mg via SUBCUTANEOUS

## 2021-07-08 NOTE — Patient Instructions (Signed)
Use condoms x 2 weeks, keep clean and dry x 24 hours, no heavy lifting, keep steri strips on x 72 hours, Keep pressure dressing on x 24 hours. Follow up prn problems.  

## 2021-07-08 NOTE — Progress Notes (Signed)
  Subjective:     Patient ID: Caroline Barnes, female   DOB: 08-16-1981, 40 y.o.   MRN: [id number]  HPI Caroline Barnes is a 40 year old black female,single, G2P2 in for nexplanon removal and reinsertion. Lab Results  Component Value Date   DIAGPAP  06/19/2021    - Negative for Intraepithelial Lesions or Malignancy (NILM)   DIAGPAP - Benign reactive/reparative changes 06/19/2021   HPV NOT DETECTED 10/20/2017   HPVHIGH Negative 06/19/2021     Review of Systems For nexplanmon removal and reinsertion Reviewed past medical,surgical, social and family history. Reviewed medications and allergies.     Objective:   Physical Exam BP (!) 156/102 (BP Location: Left Arm, Patient Position: Sitting, Cuff Size: Normal)   Pulse 73   Ht 5\' 6"  (1.676 m)   Wt 188 lb (85.3 kg)   LMP 06/11/2021   BMI 30.34 kg/m     She did not take BP meds today. UPT is negative.  Consent signed, and time out called. Left arm cleansed with betadine, and injected with 1.5 cc 2% lidocaine and waited til numb.Under sterile technique a #11 blade was used to make small vertical incision, and a curved forceps was used to easily remove rod.New rod easily inserted, and palpated by provider and pt.  Steri strips applied. Pressure dressing applied.  Fall risk is low  Upstream - 07/08/21 1514       Pregnancy Intention Screening   Does the patient want to become pregnant in the next year? No    Does the patient's partner want to become pregnant in the next year? No    Would the patient like to discuss contraceptive options today? No      Contraception Wrap Up   Current Method Hormonal Implant    End Method Hormonal Implant             Assessment:     1. Pregnancy examination or test, negative result   2. Encounter for removal and reinsertion of Nexplanon Lot # 07/10/21 exp [id number]  3. Chronic hypertension Take BP meds and get PCP    Plan:     Use condoms x 2 weeks, keep clean and dry x 24 hours, no heavy  lifting, keep steri strips on x 72 hours, Keep pressure dressing on x 24 hours. Follow up prn problems.    Remove in 3 years or sooner if desired.

## 2021-07-08 NOTE — Addendum Note (Signed)
Addended by: Colen Darling on: 07/08/2021 04:01 PM   Modules accepted: Orders

## 2021-07-22 DIAGNOSIS — F4389 Other reactions to severe stress: Secondary | ICD-10-CM | POA: Diagnosis not present

## 2021-08-19 DIAGNOSIS — F4389 Other reactions to severe stress: Secondary | ICD-10-CM | POA: Diagnosis not present

## 2021-09-02 DIAGNOSIS — F4389 Other reactions to severe stress: Secondary | ICD-10-CM | POA: Diagnosis not present

## 2021-09-30 DIAGNOSIS — F4389 Other reactions to severe stress: Secondary | ICD-10-CM | POA: Diagnosis not present

## 2021-10-07 ENCOUNTER — Other Ambulatory Visit: Payer: Self-pay

## 2021-10-07 ENCOUNTER — Ambulatory Visit: Payer: BC Managed Care – PPO | Admitting: Nurse Practitioner

## 2021-10-07 ENCOUNTER — Encounter: Payer: Self-pay | Admitting: Nurse Practitioner

## 2021-10-07 VITALS — BP 168/102 | HR 91 | Ht 66.0 in | Wt 185.0 lb

## 2021-10-07 DIAGNOSIS — I10 Essential (primary) hypertension: Secondary | ICD-10-CM

## 2021-10-07 DIAGNOSIS — E669 Obesity, unspecified: Secondary | ICD-10-CM | POA: Diagnosis not present

## 2021-10-07 DIAGNOSIS — F17200 Nicotine dependence, unspecified, uncomplicated: Secondary | ICD-10-CM

## 2021-10-07 DIAGNOSIS — Z139 Encounter for screening, unspecified: Secondary | ICD-10-CM

## 2021-10-07 NOTE — Assessment & Plan Note (Addendum)
DASH diet and commitment to daily physical activity for a minimum of 30 minutes discussed and encouraged, as a part of hypertension management. The importance of attaining a healthy weight is also discussed.  BP/Weight 10/07/2021 07/08/2021 06/19/2021 08/17/2018 06/16/2018 05/26/2018 05/01/2018  Systolic BP 168 156 173 152 148 142 135  Diastolic BP 102 102 108 102 86 85 73  Wt. (Lbs) 185 188 188.4 180 177 175 -  BMI 29.86 30.34 30.41 29.05 28.57 27.41 -  takes amlodipine 5mg  She has not been taking her amlodipine regularly patient told to take amlodipine 5 mg daily we will recheck blood pressure in 4 weeks monitor blood pressure at home and bring readings back to next visit.

## 2021-10-07 NOTE — Assessment & Plan Note (Signed)
Started smoking at 47, smokes a little over half a pack  a day. Trying to cut back.. not ready to quit Need to quit smoking including the risk of having lung cancer COPD explained to the patient and she verbalized understanding.

## 2021-10-07 NOTE — Assessment & Plan Note (Signed)
Importance of healthy food choices with portion control discussed as well as eating regularly within 12  hour window.  ° °The need to choose clean green food 50%-75% of time is discussed as well as make water the primary drink and set a goal for 64 ounces daily. ° °Patient reeducated about the importance of committment to minimum of 150 minutes of exercise per week. ° °Three meals at set times with snacks allowed between meals but they must be fruit or vegetable.  ° °Aim to eat  over 12 hour period  for example 7 am to 7 pm. Stop after your last meal of the day.  °

## 2021-10-07 NOTE — Patient Instructions (Addendum)
Please get your labs done  3-5 days before your next appointment.  Please check your blood pressure 3 times weekly at home recording your readings and bring to your next appointment    It is important that you exercise regularly at least 30 minutes 5 times a week.  Think about what you will eat, plan ahead. Choose " clean, green, fresh or frozen" over canned, processed or packaged foods which are more sugary, salty and fatty. 70 to 75% of food eaten should be vegetables and fruit. Three meals at set times with snacks allowed between meals, but they must be fruit or vegetables. Aim to eat over a 12 hour period , example 7 am to 7 pm, and STOP after  your last meal of the day. Drink water,generally about 64 ounces per day, no other drink is as healthy. Fruit juice is best enjoyed in a healthy way, by EATING the fruit.  Thanks for choosing [facility], we consider it a privelige to serve you.

## 2021-10-07 NOTE — Progress Notes (Signed)
New Patient Office Visit  Subjective:  Patient ID: Caroline Barnes, female    DOB: July 06, 1981  Age: 41 y.o. MRN: [id number]  CC:  Chief Complaint  Patient presents with   New Patient (Initial Visit)    New pt. Previous PCP Dr. Janna Arch. Would like to discuss BP concerns.     HPI [name] presents to establish care. Precious PCP was Dr Delbert Harness. Last PAP was normal in 2022 Due for flu vaccine, covid vaccine, pneumonia vaccine.   Pt has never had flu vaccine and covid vaccine. Need for vaccine discussed with pt she verbalized understanding. She refused flu vaccine today.    BP  she has not been taking amlodipine 5 mg regularly.   Tobacco user. Started smoking at 60, smokes a little over half a pack  a day. Trying to cut back.  Past Medical History:  Diagnosis Date   Cellulitis    Hypertension     Past Surgical History:  Procedure Laterality Date   NO PAST SURGERIES      Family History  Problem Relation Age of Onset   Rashes / Skin problems Cousin        Contact dermatitis   Cancer Cousin        breast   Thyroid cancer Mother    Diabetes Mother    Cancer Mother        breast   Stroke Father    Diabetes Maternal Aunt    Diabetes Maternal Uncle    Diabetes Paternal Aunt    Stroke Paternal Aunt    Diabetes Paternal Uncle    Cancer Paternal Grandmother     Social History   Socioeconomic History   Marital status: Single    Spouse name: Not on file   Number of children: Not on file   Years of education: Not on file   Highest education level: Not on file  Occupational History   Not on file  Tobacco Use   Smoking status: Every Day    Packs/day: 0.50    Types: Cigarettes   Smokeless tobacco: Never  Vaping Use   Vaping Use: Never used  Substance and Sexual Activity   Alcohol use: No    Comment: 2 or 3 days per week.   Drug use: No   Sexual activity: Yes    Birth control/protection: Implant  Other Topics Concern   Not on file   Social History Narrative   Not on file   Social Determinants of Health   Financial Resource Strain: Low Risk    Difficulty of Paying Living Expenses: Not hard at all  Food Insecurity: No Food Insecurity   Worried About Programme researcher, broadcasting/film/video in the Last Year: Never true   Ran Out of Food in the Last Year: Never true  Transportation Needs: No Transportation Needs   Lack of Transportation (Medical): No   Lack of Transportation (Non-Medical): No  Physical Activity: Inactive   Days of Exercise per Week: 0 days   Minutes of Exercise per Session: 0 min  Stress: No Stress Concern Present   Feeling of Stress : Not at all  Social Connections: Socially Isolated   Frequency of Communication with Friends and Family: More than three times a week   Frequency of Social Gatherings with Friends and Family: More than three times a week   Attends Religious Services: Never   Database administrator or Organizations: No   Attends Banker Meetings: Never  Marital Status: Never married  Catering manager Violence: Not At Risk   Fear of Current or Ex-Partner: No   Emotionally Abused: No   Physically Abused: No   Sexually Abused: No    ROS Review of Systems  Constitutional: Negative.   Respiratory: Negative.    Cardiovascular: Negative.   Psychiatric/Behavioral: Negative.     Objective:   Today's Vitals: BP (!) 177/111 (BP Location: Right Arm, Patient Position: Sitting, Cuff Size: Normal)    Pulse 91    Ht 5\' 6"  (1.676 m)    Wt 185 lb (83.9 kg)    LMP 08/23/2021 (Approximate)    SpO2 97%    BMI 29.86 kg/m   Physical Exam Constitutional:      General: She is not in acute distress.    Appearance: She is obese. She is not ill-appearing, toxic-appearing or diaphoretic.  Cardiovascular:     Rate and Rhythm: Normal rate and regular rhythm.     Pulses: Normal pulses.     Heart sounds: Normal heart sounds. No murmur heard.   No friction rub. No gallop.  Pulmonary:     Effort:  Pulmonary effort is normal. No respiratory distress.     Breath sounds: Normal breath sounds. No stridor. No wheezing, rhonchi or rales.  Chest:     Chest wall: No tenderness.  Neurological:     Mental Status: She is alert and oriented to person, place, and time.  Psychiatric:        Mood and Affect: Mood normal.        Behavior: Behavior normal.        Thought Content: Thought content normal.        Judgment: Judgment normal.    Assessment & Plan:   Problem List Items Addressed This Visit   None   Outpatient Encounter Medications as of 10/07/2021  Medication Sig   amLODipine (NORVASC) 5 MG tablet Take 1 tablet (5 mg total) by mouth daily.   Etonogestrel (NEXPLANON [place]) Inject into the skin.   No facility-administered encounter medications on file as of 10/07/2021.    Follow-up: No follow-ups on file.   10/09/2021, FNP

## 2021-10-07 NOTE — Addendum Note (Signed)
Addended by: Donell Beers on: 10/07/2021 01:41 PM   Modules accepted: Orders

## 2021-10-14 DIAGNOSIS — F4389 Other reactions to severe stress: Secondary | ICD-10-CM | POA: Diagnosis not present

## 2021-10-28 DIAGNOSIS — F4389 Other reactions to severe stress: Secondary | ICD-10-CM | POA: Diagnosis not present

## 2021-11-07 ENCOUNTER — Encounter: Payer: BC Managed Care – PPO | Admitting: Nurse Practitioner

## 2021-11-11 DIAGNOSIS — F4389 Other reactions to severe stress: Secondary | ICD-10-CM | POA: Diagnosis not present

## 2021-11-25 DIAGNOSIS — F4389 Other reactions to severe stress: Secondary | ICD-10-CM | POA: Diagnosis not present

## 2021-12-23 ENCOUNTER — Encounter: Payer: BC Managed Care – PPO | Admitting: Nurse Practitioner

## 2021-12-27 ENCOUNTER — Encounter: Payer: BC Managed Care – PPO | Admitting: Nurse Practitioner

## 2022-01-06 DIAGNOSIS — F4389 Other reactions to severe stress: Secondary | ICD-10-CM | POA: Diagnosis not present

## 2022-02-07 ENCOUNTER — Encounter: Payer: BC Managed Care – PPO | Admitting: Nurse Practitioner

## 2022-03-03 DIAGNOSIS — F4389 Other reactions to severe stress: Secondary | ICD-10-CM | POA: Diagnosis not present

## 2022-03-31 DIAGNOSIS — F4389 Other reactions to severe stress: Secondary | ICD-10-CM | POA: Diagnosis not present

## 2022-12-22 DIAGNOSIS — Z419 Encounter for procedure for purposes other than remedying health state, unspecified: Secondary | ICD-10-CM | POA: Diagnosis not present

## 2023-01-21 DIAGNOSIS — Z419 Encounter for procedure for purposes other than remedying health state, unspecified: Secondary | ICD-10-CM | POA: Diagnosis not present

## 2023-02-17 ENCOUNTER — Emergency Department (HOSPITAL_COMMUNITY)
Admission: EM | Admit: 2023-02-17 | Discharge: 2023-02-17 | Disposition: A | Discharge status: Home / Self Care | Payer: 59 | Attending: Emergency Medicine | Admitting: Emergency Medicine

## 2023-02-17 ENCOUNTER — Other Ambulatory Visit: Payer: Self-pay

## 2023-02-17 ENCOUNTER — Encounter (HOSPITAL_COMMUNITY): Payer: Self-pay

## 2023-02-17 DIAGNOSIS — M546 Pain in thoracic spine: Secondary | ICD-10-CM | POA: Diagnosis not present

## 2023-02-17 DIAGNOSIS — I10 Essential (primary) hypertension: Secondary | ICD-10-CM | POA: Diagnosis not present

## 2023-02-17 DIAGNOSIS — M545 Low back pain, unspecified: Secondary | ICD-10-CM | POA: Diagnosis present

## 2023-02-17 MED ORDER — AMLODIPINE BESYLATE 5 MG PO TABS
5.0000 mg | ORAL_TABLET | Freq: Once | ORAL | Status: AC
  Administered 2023-02-17: 5 mg via ORAL
  Filled 2023-02-17: qty 1

## 2023-02-17 MED ORDER — METHOCARBAMOL 500 MG PO TABS
1000.0000 mg | ORAL_TABLET | Freq: Once | ORAL | Status: AC
  Administered 2023-02-17: 1000 mg via ORAL
  Filled 2023-02-17: qty 2

## 2023-02-17 MED ORDER — METHOCARBAMOL 500 MG PO TABS: 1000.0000 mg | ORAL_TABLET | Freq: Three times a day (TID) | ORAL | 0 refills | Status: DC | PRN

## 2023-02-17 MED ORDER — AMLODIPINE BESYLATE 5 MG PO TABS: 5.0000 mg | ORAL_TABLET | Freq: Every day | ORAL | 3 refills | Status: AC

## 2023-02-17 MED ORDER — IBUPROFEN 400 MG PO TABS
400.0000 mg | ORAL_TABLET | Freq: Once | ORAL | Status: AC
  Administered 2023-02-17: 400 mg via ORAL
  Filled 2023-02-17: qty 1

## 2023-02-17 NOTE — ED Triage Notes (Signed)
Pt as in the shower and went to wash her backside and twisted her back and now having pain. Pt took 2 Advil with some relief of pain. Denies any recent injury or falls.

## 2023-02-17 NOTE — ED Provider Notes (Signed)
[place] EMERGENCY DEPARTMENT AT [facility] Provider Note   CSN: [id number] Arrival date & time: 02/17/23  0750     History  Chief Complaint  Patient presents with   Back Pain    Caroline Barnes is a 42 y.o. female.  HPI Patient presents for back pain.  Medical history includes HTN.  Patient was in her normal state of health this morning.  While in the shower, she was reaching around to wash her back.  She had sudden onset pain throughout bilateral musculature in area of T-spine.  She took 2 ibuprofen with some relief.  She denies any other recent symptoms.    Home Medications Prior to Admission medications   Medication Sig Start Date End Date Taking? Authorizing Provider  Etonogestrel (NEXPLANON [place]) Inject into the skin.   Yes [provider]  methocarbamol (ROBAXIN) 500 MG tablet Take 2 tablets (1,000 mg total) by mouth every 8 (eight) hours as needed for muscle spasms. 02/17/23  Yes Gloris Manchester, MD  amLODipine (NORVASC) 5 MG tablet Take 1 tablet (5 mg total) by mouth daily. 02/17/23   Gloris Manchester, MD      Allergies    Patient has no known allergies.    Review of Systems   Review of Systems  Musculoskeletal:  Positive for back pain.  All other systems reviewed and are negative.   Physical Exam Updated Vital Signs BP (!) 184/113   Pulse 87   Temp 98.2 F (36.8 C) (Oral)   Resp 18   SpO2 99%  Physical Exam Vitals and nursing note reviewed.  Constitutional:      General: She is not in acute distress.    Appearance: Normal appearance. She is well-developed. She is not ill-appearing, toxic-appearing or diaphoretic.  HENT:     Head: Normocephalic and atraumatic.     Right Ear: External ear normal.     Left Ear: External ear normal.     Nose: Nose normal.     Mouth/Throat:     Mouth: Mucous membranes are moist.  Eyes:     Extraocular Movements: Extraocular movements intact.     Conjunctiva/sclera: Conjunctivae normal.  Cardiovascular:      Rate and Rhythm: Normal rate and regular rhythm.     Heart sounds: No murmur heard. Pulmonary:     Effort: Pulmonary effort is normal. No respiratory distress.     Breath sounds: Normal breath sounds. No wheezing or rales.  Abdominal:     General: There is no distension.     Palpations: Abdomen is soft.     Tenderness: There is no right CVA tenderness or left CVA tenderness.  Musculoskeletal:        General: No swelling. Normal range of motion.     Cervical back: Neck supple.  Skin:    General: Skin is warm and dry.     Coloration: Skin is not jaundiced or pale.  Neurological:     General: No focal deficit present.     Mental Status: She is alert and oriented to person, place, and time.     Cranial Nerves: No cranial nerve deficit.     Sensory: No sensory deficit.     Motor: No weakness.     Coordination: Coordination normal.  Psychiatric:        Mood and Affect: Mood normal.        Behavior: Behavior normal.        Thought Content: Thought content normal.  Judgment: Judgment normal.     ED Results / Procedures / Treatments   Labs (all labs ordered are listed, but only abnormal results are displayed) Labs Reviewed - No data to display  EKG None  Radiology No results found.  Procedures Procedures    Medications Ordered in ED Medications  amLODipine (NORVASC) tablet 5 mg (5 mg Oral Given 02/17/23 0849)  methocarbamol (ROBAXIN) tablet 1,000 mg (1,000 mg Oral Given 02/17/23 0849)  ibuprofen (ADVIL) tablet 400 mg (400 mg Oral Given 02/17/23 0849)    ED Course/ Medical Decision Making/ A&P                             Medical Decision Making Risk Prescription drug management.   Patient presents for back pain.  Onset of back pain was this morning.  It occurred while she was reaching her arms around to her back while in the shower.  Pain has been throughout bilateral thoracic musculature.  She took ibuprofen prior to arrival with some relief.  On arrival in  the ED, vital signs are notable for hypertension.  Patient reports that she was previously taking Norvasc but ran out of her prescription.  She does request a new prescription.  Per chart review, she was previously on 5 mg Norvasc.  Dose was given here in the ED.  Patient is well-appearing on exam.  Distribution of areas of pain are consistent with muscle spasm.  Dose of Robaxin was given.  She has no spinal tenderness or CVA tenderness.  On reassessment, pain was improved.  Patient was discharged in stable condition.        Final Clinical Impression(s) / ED Diagnoses Final diagnoses:  Acute bilateral thoracic back pain    Rx / DC Orders ED Discharge Orders          Ordered    amLODipine (NORVASC) 5 MG tablet  Daily        02/17/23 1013    methocarbamol (ROBAXIN) 500 MG tablet  Every 8 hours PRN        02/17/23 1013              Gloris Manchester, MD 02/17/23 1014

## 2023-02-17 NOTE — Discharge Instructions (Signed)
A prescription to resume your blood pressure medication was sent to your pharmacy.  Take daily and continue to check your blood pressures at home.  Talk to your primary care doctor about further blood pressure management.  Take ibuprofen and Tylenol as needed for pain.  A prescription for muscle relaxer was sent to your pharmacy to take as needed.

## 2023-02-21 DIAGNOSIS — Z419 Encounter for procedure for purposes other than remedying health state, unspecified: Secondary | ICD-10-CM | POA: Diagnosis not present

## 2023-03-05 ENCOUNTER — Encounter: Payer: Self-pay | Admitting: Family Medicine

## 2023-03-05 ENCOUNTER — Ambulatory Visit (INDEPENDENT_AMBULATORY_CARE_PROVIDER_SITE_OTHER): Payer: 59 | Admitting: Family Medicine

## 2023-03-05 VITALS — BP 138/88 | HR 95 | Ht 67.0 in | Wt 185.0 lb

## 2023-03-05 DIAGNOSIS — E559 Vitamin D deficiency, unspecified: Secondary | ICD-10-CM

## 2023-03-05 DIAGNOSIS — I10 Essential (primary) hypertension: Secondary | ICD-10-CM

## 2023-03-05 DIAGNOSIS — Z131 Encounter for screening for diabetes mellitus: Secondary | ICD-10-CM

## 2023-03-05 DIAGNOSIS — Z1329 Encounter for screening for other suspected endocrine disorder: Secondary | ICD-10-CM

## 2023-03-05 DIAGNOSIS — Z13220 Encounter for screening for lipoid disorders: Secondary | ICD-10-CM

## 2023-03-05 DIAGNOSIS — M545 Low back pain, unspecified: Secondary | ICD-10-CM

## 2023-03-05 DIAGNOSIS — Z1159 Encounter for screening for other viral diseases: Secondary | ICD-10-CM

## 2023-03-05 MED ORDER — METHOCARBAMOL 500 MG PO TABS: 1000.0000 mg | ORAL_TABLET | Freq: Three times a day (TID) | ORAL | 0 refills | Status: AC | PRN

## 2023-03-05 NOTE — Assessment & Plan Note (Signed)
Xray ordered awaiting results Robaxin 500 mg PRN Discussed medication desired effects, potential side effects. Non pharmacological interventions include rest, avoid twisting, improper bending, straining lower back. Demonstration of proper body mechanics. Alternate ice and heat. Recommend stretching back and legs. Follow up for worsening or persistent symptoms. Patient verbalizes understanding regarding plan of care and all questions answered.

## 2023-03-05 NOTE — Assessment & Plan Note (Signed)
Vitals:   03/05/23 0240 03/05/23 1404  BP: 130/79 138/88  Blood pressure controlled in today's visit Continue amlodipine 5 mg daily Continued discussion on DASH diet, low sodium diet and maintain a exercise routine for 150 minutes per week.

## 2023-03-05 NOTE — Progress Notes (Signed)
Patient Office Visit   Subjective   Patient ID: Caroline Barnes, female    DOB: 13-Sep-1981  Age: 42 y.o. MRN: [id number]  CC:  Chief Complaint  Patient presents with   Hypertension    Patient was at [facility] for back pain on 5/30. Had HTN at that time. Wants f/u and general check up.     HPI Caroline Barnes 42 year old female, presents to the clinic for back pain. She  has a past medical history of Cellulitis and Hypertension.  Back Pain This is a recurrent problem. Back pain occurs intermittently and has been unchanged since onset. The pain is present in the lumbar spine. The pain does not radiate. The pain is at a severity of 8/10. The pain is The same all the time. The symptoms are aggravated by standing, sitting and bending. Pertinent negatives include no abdominal pain, bladder incontinence, bowel incontinence, chest pain, dysuria, fever, leg pain, numbness, paresis, paresthesias, pelvic pain, tingling or weakness. Risk factors include lack of exercise. She has tried muscle relaxant for the symptoms. The treatment provided mild relief.       Outpatient Encounter Medications as of 03/05/2023  Medication Sig   amLODipine (NORVASC) 5 MG tablet Take 1 tablet (5 mg total) by mouth daily.   Etonogestrel (NEXPLANON [place]) Inject into the skin.   [DISCONTINUED] methocarbamol (ROBAXIN) 500 MG tablet Take 2 tablets (1,000 mg total) by mouth every 8 (eight) hours as needed for muscle spasms.   methocarbamol (ROBAXIN) 500 MG tablet Take 2 tablets (1,000 mg total) by mouth every 8 (eight) hours as needed for muscle spasms.   No facility-administered encounter medications on file as of 03/05/2023.    Past Surgical History:  Procedure Laterality Date   NO PAST SURGERIES      Review of Systems  Constitutional:  Negative for chills and fever.  Respiratory:  Negative for shortness of breath.   Cardiovascular:  Negative for chest pain.  Gastrointestinal:  Negative for abdominal pain.   Genitourinary:  Negative for dysuria.  Musculoskeletal:  Positive for back pain, joint pain and myalgias.  Neurological:  Negative for dizziness and headaches.      Objective    BP 138/88   Pulse 95   Ht 5\' 7"  (1.702 m)   Wt 185 lb (83.9 kg)   SpO2 97%   BMI 28.98 kg/m   Physical Exam Vitals reviewed.  Constitutional:      General: She is not in acute distress.    Appearance: Normal appearance. She is not ill-appearing, toxic-appearing or diaphoretic.  HENT:     Head: Normocephalic.  Eyes:     General:        Right eye: No discharge.        Left eye: No discharge.     Conjunctiva/sclera: Conjunctivae normal.  Cardiovascular:     Pulses: Normal pulses.     Heart sounds: Normal heart sounds.  Pulmonary:     Effort: Pulmonary effort is normal. No respiratory distress.     Breath sounds: Normal breath sounds.  Abdominal:     General: Bowel sounds are normal.     Palpations: Abdomen is soft.     Tenderness: There is no abdominal tenderness. There is no right CVA tenderness, left CVA tenderness or guarding.  Musculoskeletal:     Cervical back: Normal range of motion.     Lumbar back: Decreased range of motion. Negative right straight leg raise test and negative left straight leg  raise test.  Skin:    General: Skin is warm and dry.     Capillary Refill: Capillary refill takes less than 2 seconds.  Neurological:     General: No focal deficit present.     Mental Status: She is alert and oriented to person, place, and time.     Coordination: Coordination normal.     Gait: Gait normal.  Psychiatric:        Mood and Affect: Mood normal.        Behavior: Behavior normal.       Assessment & Plan:  Primary hypertension -     CBC with Differential/Platelet -     CMP14+EGFR -     Microalbumin / creatinine urine ratio  Screening for diabetes mellitus -     Hemoglobin A1c  Screening for lipid disorders -     Lipid panel  Screening for thyroid disorder -     TSH +  free T4  Need for hepatitis C screening test -     Hepatitis C antibody  Vitamin D deficiency -     VITAMIN D 25 Hydroxy (Vit-D Deficiency, Fractures)  Lumbar pain Assessment & Plan: Xray ordered awaiting results Robaxin 500 mg PRN Discussed medication desired effects, potential side effects. Non pharmacological interventions include rest, avoid twisting, improper bending, straining lower back. Demonstration of proper body mechanics. Alternate ice and heat. Recommend stretching back and legs. Follow up for worsening or persistent symptoms. Patient verbalizes understanding regarding plan of care and all questions answered.   Orders: -     Methocarbamol; Take 2 tablets (1,000 mg total) by mouth every 8 (eight) hours as needed for muscle spasms.  Dispense: 30 tablet; Refill: 0 -     DG Lumbar Spine 2-3 Views; Future  Chronic hypertension Assessment & Plan: Vitals:   03/05/23 0240 03/05/23 1404  BP: 130/79 138/88  Blood pressure controlled in today's visit Continue amlodipine 5 mg daily Continued discussion on DASH diet, low sodium diet and maintain a exercise routine for 150 minutes per week.      Return in about 3 months (around 06/05/2023), or if symptoms worsen or fail to improve, for hypertension, routine labs.   Cruzita Lederer Newman Nip, FNP

## 2023-03-05 NOTE — Patient Instructions (Signed)

## 2023-03-10 ENCOUNTER — Ambulatory Visit: Payer: 59 | Admitting: Adult Health

## 2023-03-12 ENCOUNTER — Ambulatory Visit (HOSPITAL_COMMUNITY)
Admission: RE | Admit: 2023-03-12 | Discharge: 2023-03-12 | Disposition: A | Discharge status: Home / Self Care | Payer: 59 | Source: Ambulatory Visit | Attending: Family Medicine | Admitting: Family Medicine

## 2023-03-12 DIAGNOSIS — M545 Low back pain, unspecified: Secondary | ICD-10-CM | POA: Insufficient documentation

## 2023-03-12 DIAGNOSIS — Z131 Encounter for screening for diabetes mellitus: Secondary | ICD-10-CM | POA: Diagnosis not present

## 2023-03-12 DIAGNOSIS — Z1329 Encounter for screening for other suspected endocrine disorder: Secondary | ICD-10-CM | POA: Diagnosis not present

## 2023-03-12 DIAGNOSIS — I10 Essential (primary) hypertension: Secondary | ICD-10-CM | POA: Diagnosis not present

## 2023-03-12 DIAGNOSIS — Z13220 Encounter for screening for lipoid disorders: Secondary | ICD-10-CM | POA: Diagnosis not present

## 2023-03-12 DIAGNOSIS — Z1159 Encounter for screening for other viral diseases: Secondary | ICD-10-CM | POA: Diagnosis not present

## 2023-03-12 DIAGNOSIS — E559 Vitamin D deficiency, unspecified: Secondary | ICD-10-CM | POA: Diagnosis not present

## 2023-03-13 LAB — CMP14+EGFR
ALT: 24 IU/L (ref 0–32)
BUN/Creatinine Ratio: 10 (ref 9–23)
Bilirubin Total: 0.5 mg/dL (ref 0.0–1.2)
CO2: 21 mmol/L (ref 20–29)
Calcium: 8.3 mg/dL — ABNORMAL LOW (ref 8.7–10.2)
Chloride: 102 mmol/L (ref 96–106)
Potassium: 3.8 mmol/L (ref 3.5–5.2)
Total Protein: 7.8 g/dL (ref 6.0–8.5)
eGFR: 96 mL/min/{1.73_m2} (ref >59)

## 2023-03-13 LAB — CBC WITH DIFFERENTIAL/PLATELET
EOS (ABSOLUTE): 0.1 10*3/uL (ref 0.0–0.4)
Eos: 3 %
Hemoglobin: 14.7 g/dL (ref 11.1–15.9)
Immature Grans (Abs): 0 10*3/uL (ref 0.0–0.1)
Immature Granulocytes: 0 %
MCH: 28.9 pg (ref 26.6–33.0)
Monocytes: 9 %
Neutrophils Absolute: 2.2 10*3/uL (ref 1.4–7.0)
Neutrophils: 46 %
RDW: 12.9 % (ref 11.7–15.4)

## 2023-03-13 LAB — LIPID PANEL
HDL: 51 mg/dL (ref >39)
LDL Chol Calc (NIH): 95 mg/dL (ref 0–99)
Triglycerides: 74 mg/dL (ref 0–149)

## 2023-03-13 LAB — MICROALBUMIN / CREATININE URINE RATIO

## 2023-03-13 LAB — TSH+FREE T4
Free T4: 1.3 ng/dL (ref 0.82–1.77)
TSH: 0.67 u[IU]/mL (ref 0.450–4.500)

## 2023-03-13 LAB — HEMOGLOBIN A1C
Est. average glucose Bld gHb Est-mCnc: 128 mg/dL
Hgb A1c MFr Bld: 6.1 % — ABNORMAL HIGH (ref 4.8–5.6)

## 2023-03-14 LAB — LIPID PANEL
Chol/HDL Ratio: 3.1 ratio (ref 0.0–4.4)
Cholesterol, Total: 160 mg/dL (ref 100–199)
VLDL Cholesterol Cal: 14 mg/dL (ref 5–40)

## 2023-03-14 LAB — CMP14+EGFR
AST: 19 IU/L (ref 0–40)
Albumin: 4.4 g/dL (ref 3.9–4.9)
Alkaline Phosphatase: 75 IU/L (ref 44–121)
BUN: 8 mg/dL (ref 6–20)
Creatinine, Ser: 0.8 mg/dL (ref 0.57–1.00)
Globulin, Total: 3.4 g/dL (ref 1.5–4.5)
Glucose: 95 mg/dL (ref 70–99)
Sodium: 138 mmol/L (ref 134–144)

## 2023-03-14 LAB — CBC WITH DIFFERENTIAL/PLATELET
Basophils Absolute: 0 10*3/uL (ref 0.0–0.2)
Basos: 1 %
Hematocrit: 44 % (ref 34.0–46.6)
Lymphocytes Absolute: 2 10*3/uL (ref 0.7–3.1)
Lymphs: 41 %
MCHC: 33.4 g/dL (ref 31.5–35.7)
MCV: 87 fL (ref 79–97)
Monocytes Absolute: 0.4 10*3/uL (ref 0.1–0.9)
Platelets: 281 10*3/uL (ref 150–450)
RBC: 5.08 x10E6/uL (ref 3.77–5.28)
WBC: 4.8 10*3/uL (ref 3.4–10.8)

## 2023-03-14 LAB — VITAMIN D 25 HYDROXY (VIT D DEFICIENCY, FRACTURES): Vit D, 25-Hydroxy: 9.9 ng/mL — ABNORMAL LOW (ref 30.0–100.0)

## 2023-03-14 LAB — MICROALBUMIN / CREATININE URINE RATIO
Creatinine, Urine: 230.1 mg/dL
Microalbumin, Urine: 11.1 ug/mL

## 2023-03-14 LAB — HEPATITIS C ANTIBODY: Hep C Virus Ab: NONREACTIVE

## 2023-03-16 ENCOUNTER — Ambulatory Visit (INDEPENDENT_AMBULATORY_CARE_PROVIDER_SITE_OTHER): Payer: 59 | Admitting: Adult Health

## 2023-03-16 ENCOUNTER — Encounter: Payer: Self-pay | Admitting: Adult Health

## 2023-03-16 VITALS — BP 122/89 | HR 86 | Ht 67.0 in | Wt 186.0 lb

## 2023-03-16 DIAGNOSIS — N926 Irregular menstruation, unspecified: Secondary | ICD-10-CM | POA: Insufficient documentation

## 2023-03-16 DIAGNOSIS — Z975 Presence of (intrauterine) contraceptive device: Secondary | ICD-10-CM | POA: Diagnosis not present

## 2023-03-16 DIAGNOSIS — N9489 Other specified conditions associated with female genital organs and menstrual cycle: Secondary | ICD-10-CM | POA: Diagnosis not present

## 2023-03-16 MED ORDER — MEGESTROL ACETATE 40 MG PO TABS: ORAL_TABLET | ORAL | 1 refills | Status: AC

## 2023-03-16 NOTE — Progress Notes (Signed)
  Subjective:     Patient ID: Caroline Barnes, female   DOB: 1981/04/10, 42 y.o.   MRN: [id number]  HPI Caroline Barnes is a 42 year old black female,single,G2P2002, in complaining of bleeding with nexplanon, lasts 7-28 days and has cramps at times too.    Component Value Date/Time   DIAGPAP  06/19/2021 0839    - Negative for Intraepithelial Lesions or Malignancy (NILM)   DIAGPAP - Benign reactive/reparative changes 06/19/2021 0839   DIAGPAP  10/20/2017 0000    NEGATIVE FOR INTRAEPITHELIAL LESIONS OR MALIGNANCY.   HPVHIGH Negative 06/19/2021 0839   ADEQPAP  06/19/2021 0839    Satisfactory for evaluation; transformation zone component PRESENT.   ADEQPAP  10/20/2017 0000    Satisfactory for evaluation  endocervical/transformation zone component PRESENT.   PCP is Caroline Polanco FNP.  Review of Systems Bleeding with nexplanon 7-28 days Cramps at times No sex in about 8-9 months  Reviewed past medical,surgical, social and family history. Reviewed medications and allergies.     Objective:   Physical Exam BP 122/89 (BP Location: Left Arm, Patient Position: Sitting, Cuff Size: Normal)   Pulse 86   Ht 5\' 7"  (1.702 m)   Wt 186 lb (84.4 kg)   LMP 03/02/2023   BMI 29.13 kg/m     Skin warm and dry.Pelvic: external genitalia is normal in appearance no lesions, vagina: white discharge without odor,urethra has no lesions or masses noted, cervix:smooth and bulbous, uterus: normal size, shape and contour, non tender, no masses felt, adnexa: no masses or tenderness noted. Bladder is non tender and no masses felt.  Upstream - 03/16/23 1500       Pregnancy Intention Screening   Does the patient want to become pregnant in the next year? No    Does the patient's partner want to become pregnant in the next year? No    Would the patient like to discuss contraceptive options today? No      Contraception Wrap Up   Current Method Hormonal Implant    End Method Hormonal Implant    Contraception Counseling  Provided No            Examination chaperoned by Dr Erick Alley  Assessment:     1. Irregular bleeding May bleed 7-28 days with period, and has cramps at times Will rx megace to stop bleeding Meds ordered this encounter  Medications   megestrol (MEGACE) 40 MG tablet    Sig: Take 3 x 5 days then 2 x 5 days and then 1 daily til no bleeding    Dispense:  45 tablet    Refill:  1    Order Specific Question:   Supervising Provider    Answer:   Despina Hidden, LUTHER H [[id number]]     2. Nexplanon in place Placed 07/08/21  3. Uterine cramping Cramps at times     Plan:     Return about 06/20/24 for pap and physical

## 2023-03-23 DIAGNOSIS — Z419 Encounter for procedure for purposes other than remedying health state, unspecified: Secondary | ICD-10-CM | POA: Diagnosis not present

## 2023-04-23 DIAGNOSIS — Z419 Encounter for procedure for purposes other than remedying health state, unspecified: Secondary | ICD-10-CM | POA: Diagnosis not present

## 2023-05-24 DIAGNOSIS — Z419 Encounter for procedure for purposes other than remedying health state, unspecified: Secondary | ICD-10-CM | POA: Diagnosis not present

## 2023-06-10 ENCOUNTER — Encounter: Payer: Self-pay | Admitting: Family Medicine

## 2023-06-10 ENCOUNTER — Ambulatory Visit (INDEPENDENT_AMBULATORY_CARE_PROVIDER_SITE_OTHER): Payer: 59 | Admitting: Family Medicine

## 2023-06-10 VITALS — BP 166/100 | HR 85 | Ht 67.0 in | Wt 180.1 lb

## 2023-06-10 DIAGNOSIS — I10 Essential (primary) hypertension: Secondary | ICD-10-CM

## 2023-06-10 DIAGNOSIS — R7303 Prediabetes: Secondary | ICD-10-CM

## 2023-06-10 MED ORDER — VITAMIN D3 25 MCG (1000 UT) PO CAPS: 1000.0000 [IU] | ORAL_CAPSULE | Freq: Every day | ORAL | 1 refills | Status: AC

## 2023-06-10 NOTE — Patient Instructions (Signed)

## 2023-06-10 NOTE — Assessment & Plan Note (Addendum)
Vitals:   06/10/23 1347 06/10/23 1348  BP: (!) 177/85 (!) 166/100   Blood pressure not controlled in today's visit Patient reports not taking medication today Advise the importance of medication compliance Start taking Amlodipine 5 mg daily, follow up in 2 weeks via [facility] with at home blood pressure reading to monitor trends. Continued discussion on DASH diet, low sodium diet and maintain a exercise routine for 150 minutes per week.

## 2023-06-10 NOTE — Progress Notes (Signed)
Patient Office Visit   Subjective   Patient ID: Caroline Barnes, female    DOB: 12-Feb-1981  Age: 42 y.o. MRN: [id number]  CC:  Chief Complaint  Patient presents with   Hypertension    F/u Routine labs.     HPI Caroline Barnes 42 year old female, presents to the clinic for HTN follow up. She  has a past medical history of Cellulitis and Hypertension.For the details of today's visit, please refer to assessment and plan.   HPI    Outpatient Encounter Medications as of 06/10/2023  Medication Sig   Cholecalciferol (VITAMIN D3) 25 MCG (1000 UT) CAPS Take 1 capsule (1,000 Units total) by mouth daily.   Etonogestrel (NEXPLANON [place]) Inject into the skin.   amLODipine (NORVASC) 5 MG tablet Take 1 tablet (5 mg total) by mouth daily. (Patient not taking: Reported on 06/10/2023)   megestrol (MEGACE) 40 MG tablet Take 3 x 5 days then 2 x 5 days and then 1 daily til no bleeding (Patient not taking: Reported on 06/10/2023)   methocarbamol (ROBAXIN) 500 MG tablet Take 2 tablets (1,000 mg total) by mouth every 8 (eight) hours as needed for muscle spasms. (Patient not taking: Reported on 06/10/2023)   No facility-administered encounter medications on file as of 06/10/2023.    Past Surgical History:  Procedure Laterality Date   NO PAST SURGERIES      Review of Systems  Constitutional:  Negative for chills and fever.  HENT:  Negative for tinnitus.   Eyes:  Negative for blurred vision.  Respiratory:  Negative for shortness of breath.   Cardiovascular:  Negative for chest pain.  Gastrointestinal:  Negative for abdominal pain.  Genitourinary:  Negative for dysuria.  Musculoskeletal:  Negative for myalgias.  Neurological:  Positive for headaches. Negative for dizziness.      Objective    BP (!) 166/100 (BP Location: Right Arm, Patient Position: Sitting, Cuff Size: Large)   Pulse 85   Ht 5\' 7"  (1.702 m)   Wt 180 lb 1.9 oz (81.7 kg)   SpO2 97%   BMI 28.21 kg/m   Physical  Exam Vitals reviewed.  Constitutional:      General: She is not in acute distress.    Appearance: Normal appearance. She is not ill-appearing, toxic-appearing or diaphoretic.  HENT:     Head: Normocephalic.  Eyes:     General:        Right eye: No discharge.        Left eye: No discharge.     Conjunctiva/sclera: Conjunctivae normal.  Cardiovascular:     Rate and Rhythm: Normal rate.     Pulses: Normal pulses.     Heart sounds: Normal heart sounds.  Pulmonary:     Effort: Pulmonary effort is normal. No respiratory distress.     Breath sounds: Normal breath sounds.  Abdominal:     General: Bowel sounds are normal.     Palpations: Abdomen is soft.     Tenderness: There is no abdominal tenderness. There is no right CVA tenderness, left CVA tenderness or guarding.  Musculoskeletal:        General: Normal range of motion.     Cervical back: Normal range of motion.  Skin:    General: Skin is warm and dry.     Capillary Refill: Capillary refill takes less than 2 seconds.  Neurological:     General: No focal deficit present.     Mental Status: She is alert.  Coordination: Coordination normal.     Gait: Gait normal.  Psychiatric:        Mood and Affect: Mood normal.        Behavior: Behavior normal.       Assessment & Plan:  Prediabetes -     Hemoglobin A1c  Primary hypertension -     BMP8+eGFR -     CBC with Differential/Platelet  Chronic hypertension Assessment & Plan: Vitals:   06/10/23 1347 06/10/23 1348  BP: (!) 177/85 (!) 166/100   Blood pressure not controlled in today's visit Patient reports not taking medication today Advise the importance of medication compliance Start taking Amlodipine 5 mg daily, follow up in 2 weeks via [facility] with at home blood pressure reading to monitor trends. Continued discussion on DASH diet, low sodium diet and maintain a exercise routine for 150 minutes per week.    Other orders -     Vitamin D3; Take 1 capsule (1,000 Units  total) by mouth daily.  Dispense: 90 capsule; Refill: 1    Return in about 4 months (around 10/10/2023), or if symptoms worsen or fail to improve, for pre-diabetes, hypertension.   Caroline Lederer Newman Nip, FNP

## 2023-06-12 DIAGNOSIS — I10 Essential (primary) hypertension: Secondary | ICD-10-CM | POA: Diagnosis not present

## 2023-06-12 DIAGNOSIS — R7303 Prediabetes: Secondary | ICD-10-CM | POA: Diagnosis not present

## 2023-06-13 LAB — CBC WITH DIFFERENTIAL/PLATELET
Basophils Absolute: 0 10*3/uL (ref 0.0–0.2)
Basos: 1 %
EOS (ABSOLUTE): 0.1 10*3/uL (ref 0.0–0.4)
Eos: 3 %
Hematocrit: 46.8 % — ABNORMAL HIGH (ref 34.0–46.6)
Hemoglobin: 15.2 g/dL (ref 11.1–15.9)
Immature Grans (Abs): 0 10*3/uL (ref 0.0–0.1)
Immature Granulocytes: 0 %
Lymphocytes Absolute: 1.6 10*3/uL (ref 0.7–3.1)
Lymphs: 36 %
MCH: 28.5 pg (ref 26.6–33.0)
MCHC: 32.5 g/dL (ref 31.5–35.7)
MCV: 88 fL (ref 79–97)
Monocytes Absolute: 0.5 10*3/uL (ref 0.1–0.9)
Monocytes: 10 %
Neutrophils Absolute: 2.2 10*3/uL (ref 1.4–7.0)
Neutrophils: 50 %
Platelets: 277 10*3/uL (ref 150–450)
RBC: 5.34 x10E6/uL — ABNORMAL HIGH (ref 3.77–5.28)
RDW: 12.9 % (ref 11.7–15.4)
WBC: 4.5 10*3/uL (ref 3.4–10.8)

## 2023-06-13 LAB — BMP8+EGFR
BUN/Creatinine Ratio: 10 (ref 9–23)
BUN: 8 mg/dL (ref 6–20)
CO2: 23 mmol/L (ref 20–29)
Calcium: 8.8 mg/dL (ref 8.7–10.2)
Chloride: 102 mmol/L (ref 96–106)
Creatinine, Ser: 0.82 mg/dL (ref 0.57–1.00)
Glucose: 99 mg/dL (ref 70–99)
Potassium: 3.8 mmol/L (ref 3.5–5.2)
Sodium: 137 mmol/L (ref 134–144)
eGFR: 93 mL/min/{1.73_m2} (ref >59)

## 2023-06-13 LAB — HEMOGLOBIN A1C
Est. average glucose Bld gHb Est-mCnc: 123 mg/dL
Hgb A1c MFr Bld: 5.9 % — ABNORMAL HIGH (ref 4.8–5.6)

## 2023-06-23 DIAGNOSIS — Z419 Encounter for procedure for purposes other than remedying health state, unspecified: Secondary | ICD-10-CM | POA: Diagnosis not present

## 2023-07-24 DIAGNOSIS — Z419 Encounter for procedure for purposes other than remedying health state, unspecified: Secondary | ICD-10-CM | POA: Diagnosis not present

## 2023-08-23 DIAGNOSIS — Z419 Encounter for procedure for purposes other than remedying health state, unspecified: Secondary | ICD-10-CM | POA: Diagnosis not present

## 2023-09-23 DIAGNOSIS — Z419 Encounter for procedure for purposes other than remedying health state, unspecified: Secondary | ICD-10-CM | POA: Diagnosis not present

## 2023-10-24 DIAGNOSIS — Z419 Encounter for procedure for purposes other than remedying health state, unspecified: Secondary | ICD-10-CM | POA: Diagnosis not present

## 2023-11-21 DIAGNOSIS — Z419 Encounter for procedure for purposes other than remedying health state, unspecified: Secondary | ICD-10-CM | POA: Diagnosis not present

## 2024-01-02 DIAGNOSIS — Z419 Encounter for procedure for purposes other than remedying health state, unspecified: Secondary | ICD-10-CM | POA: Diagnosis not present

## 2024-02-01 DIAGNOSIS — Z419 Encounter for procedure for purposes other than remedying health state, unspecified: Secondary | ICD-10-CM | POA: Diagnosis not present

## 2024-03-03 DIAGNOSIS — Z419 Encounter for procedure for purposes other than remedying health state, unspecified: Secondary | ICD-10-CM | POA: Diagnosis not present

## 2024-04-02 DIAGNOSIS — Z419 Encounter for procedure for purposes other than remedying health state, unspecified: Secondary | ICD-10-CM | POA: Diagnosis not present

## 2024-05-03 DIAGNOSIS — Z419 Encounter for procedure for purposes other than remedying health state, unspecified: Secondary | ICD-10-CM | POA: Diagnosis not present

## 2024-06-03 DIAGNOSIS — Z419 Encounter for procedure for purposes other than remedying health state, unspecified: Secondary | ICD-10-CM | POA: Diagnosis not present

## 2024-06-27 ENCOUNTER — Ambulatory Visit: Admitting: Obstetrics and Gynecology

## 2024-07-03 DIAGNOSIS — Z419 Encounter for procedure for purposes other than remedying health state, unspecified: Secondary | ICD-10-CM | POA: Diagnosis not present

## 2024-07-14 ENCOUNTER — Ambulatory Visit: Admitting: Advanced Practice Midwife

## 2024-08-03 ENCOUNTER — Ambulatory Visit: Admitting: Women's Health

## 2024-08-31 ENCOUNTER — Encounter: Admitting: Women's Health

## 2024-10-12 ENCOUNTER — Ambulatory Visit: Admitting: Obstetrics & Gynecology

## 2024-10-12 ENCOUNTER — Encounter: Payer: Self-pay | Admitting: Obstetrics & Gynecology

## 2024-10-12 VITALS — BP 174/120 | HR 71 | Wt 180.6 lb

## 2024-10-12 DIAGNOSIS — Z30017 Encounter for initial prescription of implantable subdermal contraceptive: Secondary | ICD-10-CM

## 2024-10-12 DIAGNOSIS — Z3202 Encounter for pregnancy test, result negative: Secondary | ICD-10-CM

## 2024-10-12 LAB — POCT URINE PREGNANCY: Preg Test, Ur: NEGATIVE

## 2024-10-12 MED ORDER — ETONOGESTREL 68 MG ~~LOC~~ IMPL
68.0000 mg | DRUG_IMPLANT | Freq: Once | SUBCUTANEOUS | Status: AC
  Administered 2024-10-12: 68 mg via SUBCUTANEOUS

## 2024-11-14 ENCOUNTER — Ambulatory Visit: Admitting: Obstetrics & Gynecology
